# Patient Record
Sex: Female | Born: 1950 | Race: White | Hispanic: No | Smoking: Never smoker
Health system: Southern US, Community
[De-identification: ages and names within clinical notes are randomized; demographics above are authoritative.]

## PROBLEM LIST (undated history)

## (undated) DIAGNOSIS — J45909 Unspecified asthma, uncomplicated: Secondary | ICD-10-CM

## (undated) DIAGNOSIS — T7840XA Allergy, unspecified, initial encounter: Secondary | ICD-10-CM

## (undated) DIAGNOSIS — M1990 Unspecified osteoarthritis, unspecified site: Secondary | ICD-10-CM

## (undated) DIAGNOSIS — K219 Gastro-esophageal reflux disease without esophagitis: Secondary | ICD-10-CM

## (undated) DIAGNOSIS — D689 Coagulation defect, unspecified: Secondary | ICD-10-CM

## (undated) HISTORY — DX: Unspecified asthma, uncomplicated: J45.909

## (undated) HISTORY — DX: Coagulation defect, unspecified: D68.9

## (undated) HISTORY — DX: Gastro-esophageal reflux disease without esophagitis: K21.9

## (undated) HISTORY — DX: Unspecified osteoarthritis, unspecified site: M19.90

## (undated) HISTORY — DX: Allergy, unspecified, initial encounter: T78.40XA

---

## 2001-08-04 ENCOUNTER — Encounter: Payer: Self-pay | Admitting: Internal Medicine

## 2001-08-04 ENCOUNTER — Encounter: Admission: RE | Admit: 2001-08-04 | Discharge: 2001-08-04 | Payer: Self-pay | Admitting: Internal Medicine

## 2008-03-04 ENCOUNTER — Encounter: Admission: RE | Admit: 2008-03-04 | Discharge: 2008-03-04 | Payer: Self-pay | Admitting: Specialist

## 2008-11-15 ENCOUNTER — Encounter: Admission: RE | Admit: 2008-11-15 | Discharge: 2008-11-15 | Payer: Self-pay | Admitting: Specialist

## 2010-01-17 DIAGNOSIS — L719 Rosacea, unspecified: Secondary | ICD-10-CM | POA: Insufficient documentation

## 2010-01-17 DIAGNOSIS — R002 Palpitations: Secondary | ICD-10-CM | POA: Insufficient documentation

## 2010-01-17 DIAGNOSIS — R7301 Impaired fasting glucose: Secondary | ICD-10-CM | POA: Insufficient documentation

## 2010-01-17 DIAGNOSIS — K219 Gastro-esophageal reflux disease without esophagitis: Secondary | ICD-10-CM | POA: Insufficient documentation

## 2010-01-17 DIAGNOSIS — J309 Allergic rhinitis, unspecified: Secondary | ICD-10-CM | POA: Insufficient documentation

## 2010-01-17 DIAGNOSIS — R0600 Dyspnea, unspecified: Secondary | ICD-10-CM | POA: Insufficient documentation

## 2010-01-17 DIAGNOSIS — M47819 Spondylosis without myelopathy or radiculopathy, site unspecified: Secondary | ICD-10-CM | POA: Insufficient documentation

## 2010-01-17 DIAGNOSIS — N393 Stress incontinence (female) (male): Secondary | ICD-10-CM | POA: Insufficient documentation

## 2010-01-17 DIAGNOSIS — Z780 Asymptomatic menopausal state: Secondary | ICD-10-CM | POA: Insufficient documentation

## 2010-07-12 DIAGNOSIS — Z2839 Other underimmunization status: Secondary | ICD-10-CM | POA: Insufficient documentation

## 2010-10-14 HISTORY — PX: KNEE SURGERY: SHX244

## 2010-11-26 ENCOUNTER — Ambulatory Visit: Payer: Self-pay | Admitting: Specialist

## 2010-12-04 ENCOUNTER — Inpatient Hospital Stay: Payer: Self-pay | Admitting: Specialist

## 2011-02-19 ENCOUNTER — Ambulatory Visit: Payer: Self-pay | Admitting: Specialist

## 2011-03-05 ENCOUNTER — Inpatient Hospital Stay: Payer: Self-pay | Admitting: Specialist

## 2011-08-05 ENCOUNTER — Encounter: Payer: Self-pay | Admitting: Gastroenterology

## 2011-08-15 ENCOUNTER — Ambulatory Visit (AMBULATORY_SURGERY_CENTER): Payer: PRIVATE HEALTH INSURANCE | Admitting: *Deleted

## 2011-08-15 ENCOUNTER — Encounter: Payer: Self-pay | Admitting: Gastroenterology

## 2011-08-15 VITALS — Ht 65.0 in | Wt 240.0 lb

## 2011-08-15 DIAGNOSIS — Z1211 Encounter for screening for malignant neoplasm of colon: Secondary | ICD-10-CM

## 2011-08-15 MED ORDER — PEG-KCL-NACL-NASULF-NA ASC-C 100 G PO SOLR: ORAL | Status: DC

## 2011-08-28 ENCOUNTER — Encounter: Payer: Self-pay | Admitting: Gastroenterology

## 2011-08-28 ENCOUNTER — Ambulatory Visit (AMBULATORY_SURGERY_CENTER): Payer: PRIVATE HEALTH INSURANCE | Admitting: Gastroenterology

## 2011-08-28 VITALS — BP 117/67 | HR 74 | Temp 98.4°F | Resp 20 | Ht 65.0 in | Wt 240.0 lb

## 2011-08-28 DIAGNOSIS — Z1211 Encounter for screening for malignant neoplasm of colon: Secondary | ICD-10-CM

## 2011-08-28 MED ORDER — SODIUM CHLORIDE 0.9 % IV SOLN: 500.0000 mL | INTRAVENOUS | Status: DC

## 2011-08-28 NOTE — Patient Instructions (Signed)
Green and blue discharge instructions reviewed with patient and care partner.  Impressions/recommendations:  Normal colonoscopy, repeat colonoscopy in 10 years.  Resume medications as you had been taking them prior to your procedure.

## 2011-08-28 NOTE — Progress Notes (Signed)
Patient did not experience any of the following events: a burn prior to discharge; a fall within the facility; wrong site/side/patient/procedure/implant event; or a hospital transfer or hospital admission upon discharge from the facility. (G8907) Patient did not have preoperative order for IV antibiotic SSI prophylaxis. ([id number])  

## 2011-08-29 ENCOUNTER — Telehealth: Payer: Self-pay

## 2011-08-29 NOTE — Telephone Encounter (Signed)

## 2013-01-20 ENCOUNTER — Encounter: Payer: Self-pay | Admitting: Internal Medicine

## 2014-10-24 DIAGNOSIS — K589 Irritable bowel syndrome without diarrhea: Secondary | ICD-10-CM | POA: Insufficient documentation

## 2014-12-05 DIAGNOSIS — H8110 Benign paroxysmal vertigo, unspecified ear: Secondary | ICD-10-CM | POA: Insufficient documentation

## 2015-06-20 DIAGNOSIS — R310 Gross hematuria: Secondary | ICD-10-CM | POA: Insufficient documentation

## 2015-11-28 DIAGNOSIS — Z0000 Encounter for general adult medical examination without abnormal findings: Secondary | ICD-10-CM | POA: Insufficient documentation

## 2016-01-03 DIAGNOSIS — J209 Acute bronchitis, unspecified: Secondary | ICD-10-CM | POA: Insufficient documentation

## 2016-04-17 DIAGNOSIS — Z96653 Presence of artificial knee joint, bilateral: Secondary | ICD-10-CM | POA: Insufficient documentation

## 2016-05-14 DIAGNOSIS — M19031 Primary osteoarthritis, right wrist: Secondary | ICD-10-CM | POA: Diagnosis not present

## 2016-05-15 DIAGNOSIS — M25551 Pain in right hip: Secondary | ICD-10-CM | POA: Diagnosis not present

## 2016-05-28 DIAGNOSIS — M47819 Spondylosis without myelopathy or radiculopathy, site unspecified: Secondary | ICD-10-CM | POA: Diagnosis not present

## 2016-05-28 DIAGNOSIS — M25551 Pain in right hip: Secondary | ICD-10-CM | POA: Diagnosis not present

## 2016-06-04 DIAGNOSIS — M25551 Pain in right hip: Secondary | ICD-10-CM | POA: Diagnosis not present

## 2016-06-11 DIAGNOSIS — M25551 Pain in right hip: Secondary | ICD-10-CM | POA: Diagnosis not present

## 2016-06-18 DIAGNOSIS — M25551 Pain in right hip: Secondary | ICD-10-CM | POA: Diagnosis not present

## 2016-07-27 DIAGNOSIS — Z23 Encounter for immunization: Secondary | ICD-10-CM | POA: Diagnosis not present

## 2016-07-31 DIAGNOSIS — M25531 Pain in right wrist: Secondary | ICD-10-CM | POA: Diagnosis not present

## 2016-07-31 DIAGNOSIS — M19039 Primary osteoarthritis, unspecified wrist: Secondary | ICD-10-CM | POA: Diagnosis not present

## 2016-07-31 DIAGNOSIS — M1811 Unilateral primary osteoarthritis of first carpometacarpal joint, right hand: Secondary | ICD-10-CM | POA: Diagnosis not present

## 2016-08-16 DIAGNOSIS — Z1231 Encounter for screening mammogram for malignant neoplasm of breast: Secondary | ICD-10-CM | POA: Diagnosis not present

## 2016-12-02 DIAGNOSIS — Z7689 Persons encountering health services in other specified circumstances: Secondary | ICD-10-CM | POA: Diagnosis not present

## 2016-12-02 DIAGNOSIS — R7301 Impaired fasting glucose: Secondary | ICD-10-CM | POA: Diagnosis not present

## 2016-12-02 DIAGNOSIS — Z79899 Other long term (current) drug therapy: Secondary | ICD-10-CM | POA: Diagnosis not present

## 2016-12-09 DIAGNOSIS — M47819 Spondylosis without myelopathy or radiculopathy, site unspecified: Secondary | ICD-10-CM | POA: Diagnosis not present

## 2016-12-09 DIAGNOSIS — M1731 Unilateral post-traumatic osteoarthritis, right knee: Secondary | ICD-10-CM | POA: Diagnosis not present

## 2016-12-09 DIAGNOSIS — Z1389 Encounter for screening for other disorder: Secondary | ICD-10-CM | POA: Diagnosis not present

## 2016-12-09 DIAGNOSIS — Z0000 Encounter for general adult medical examination without abnormal findings: Secondary | ICD-10-CM | POA: Diagnosis not present

## 2016-12-09 DIAGNOSIS — R7301 Impaired fasting glucose: Secondary | ICD-10-CM | POA: Diagnosis not present

## 2016-12-09 DIAGNOSIS — J3089 Other allergic rhinitis: Secondary | ICD-10-CM | POA: Diagnosis not present

## 2016-12-09 DIAGNOSIS — M1732 Unilateral post-traumatic osteoarthritis, left knee: Secondary | ICD-10-CM | POA: Diagnosis not present

## 2016-12-09 DIAGNOSIS — K589 Irritable bowel syndrome without diarrhea: Secondary | ICD-10-CM | POA: Diagnosis not present

## 2016-12-09 DIAGNOSIS — Z6838 Body mass index (BMI) 38.0-38.9, adult: Secondary | ICD-10-CM | POA: Diagnosis not present

## 2016-12-09 DIAGNOSIS — K219 Gastro-esophageal reflux disease without esophagitis: Secondary | ICD-10-CM | POA: Diagnosis not present

## 2016-12-09 DIAGNOSIS — H8113 Benign paroxysmal vertigo, bilateral: Secondary | ICD-10-CM | POA: Diagnosis not present

## 2016-12-09 DIAGNOSIS — Z1231 Encounter for screening mammogram for malignant neoplasm of breast: Secondary | ICD-10-CM | POA: Diagnosis not present

## 2016-12-17 DIAGNOSIS — Z780 Asymptomatic menopausal state: Secondary | ICD-10-CM | POA: Diagnosis not present

## 2016-12-25 DIAGNOSIS — Z1212 Encounter for screening for malignant neoplasm of rectum: Secondary | ICD-10-CM | POA: Diagnosis not present

## 2017-01-22 DIAGNOSIS — H5203 Hypermetropia, bilateral: Secondary | ICD-10-CM | POA: Diagnosis not present

## 2017-01-28 DIAGNOSIS — M25511 Pain in right shoulder: Secondary | ICD-10-CM | POA: Diagnosis not present

## 2017-01-28 DIAGNOSIS — G8929 Other chronic pain: Secondary | ICD-10-CM | POA: Diagnosis not present

## 2017-02-26 DIAGNOSIS — Z96653 Presence of artificial knee joint, bilateral: Secondary | ICD-10-CM | POA: Diagnosis not present

## 2017-02-26 DIAGNOSIS — M25562 Pain in left knee: Secondary | ICD-10-CM | POA: Diagnosis not present

## 2017-02-26 DIAGNOSIS — M25661 Stiffness of right knee, not elsewhere classified: Secondary | ICD-10-CM | POA: Diagnosis not present

## 2017-02-28 DIAGNOSIS — G8929 Other chronic pain: Secondary | ICD-10-CM | POA: Diagnosis not present

## 2017-02-28 DIAGNOSIS — M7541 Impingement syndrome of right shoulder: Secondary | ICD-10-CM | POA: Diagnosis not present

## 2017-02-28 DIAGNOSIS — M25511 Pain in right shoulder: Secondary | ICD-10-CM | POA: Diagnosis not present

## 2017-05-28 DIAGNOSIS — Z79899 Other long term (current) drug therapy: Secondary | ICD-10-CM | POA: Diagnosis not present

## 2017-06-30 DIAGNOSIS — M25511 Pain in right shoulder: Secondary | ICD-10-CM | POA: Diagnosis not present

## 2017-06-30 DIAGNOSIS — G8929 Other chronic pain: Secondary | ICD-10-CM | POA: Diagnosis not present

## 2017-07-07 DIAGNOSIS — M7541 Impingement syndrome of right shoulder: Secondary | ICD-10-CM | POA: Diagnosis not present

## 2017-07-07 DIAGNOSIS — M25511 Pain in right shoulder: Secondary | ICD-10-CM | POA: Diagnosis not present

## 2017-07-07 DIAGNOSIS — G8929 Other chronic pain: Secondary | ICD-10-CM | POA: Diagnosis not present

## 2017-07-07 DIAGNOSIS — M50022 Cervical disc disorder at C5-C6 level with myelopathy: Secondary | ICD-10-CM | POA: Diagnosis not present

## 2017-08-25 DIAGNOSIS — Z803 Family history of malignant neoplasm of breast: Secondary | ICD-10-CM | POA: Diagnosis not present

## 2017-08-25 DIAGNOSIS — Z1231 Encounter for screening mammogram for malignant neoplasm of breast: Secondary | ICD-10-CM | POA: Diagnosis not present

## 2017-09-15 DIAGNOSIS — R05 Cough: Secondary | ICD-10-CM | POA: Diagnosis not present

## 2017-09-15 DIAGNOSIS — Z6837 Body mass index (BMI) 37.0-37.9, adult: Secondary | ICD-10-CM | POA: Diagnosis not present

## 2017-09-15 DIAGNOSIS — J111 Influenza due to unidentified influenza virus with other respiratory manifestations: Secondary | ICD-10-CM | POA: Diagnosis not present

## 2017-09-15 DIAGNOSIS — J189 Pneumonia, unspecified organism: Secondary | ICD-10-CM | POA: Insufficient documentation

## 2017-10-28 DIAGNOSIS — J189 Pneumonia, unspecified organism: Secondary | ICD-10-CM | POA: Diagnosis not present

## 2018-01-06 DIAGNOSIS — R7301 Impaired fasting glucose: Secondary | ICD-10-CM | POA: Diagnosis not present

## 2018-01-06 DIAGNOSIS — R82998 Other abnormal findings in urine: Secondary | ICD-10-CM | POA: Diagnosis not present

## 2018-01-06 DIAGNOSIS — Z79899 Other long term (current) drug therapy: Secondary | ICD-10-CM | POA: Diagnosis not present

## 2018-01-14 DIAGNOSIS — Z1212 Encounter for screening for malignant neoplasm of rectum: Secondary | ICD-10-CM | POA: Diagnosis not present

## 2018-01-16 DIAGNOSIS — R0609 Other forms of dyspnea: Secondary | ICD-10-CM | POA: Diagnosis not present

## 2018-01-16 DIAGNOSIS — R310 Gross hematuria: Secondary | ICD-10-CM | POA: Diagnosis not present

## 2018-01-16 DIAGNOSIS — M47819 Spondylosis without myelopathy or radiculopathy, site unspecified: Secondary | ICD-10-CM | POA: Diagnosis not present

## 2018-01-16 DIAGNOSIS — Z1389 Encounter for screening for other disorder: Secondary | ICD-10-CM | POA: Diagnosis not present

## 2018-01-16 DIAGNOSIS — Z0000 Encounter for general adult medical examination without abnormal findings: Secondary | ICD-10-CM | POA: Diagnosis not present

## 2018-01-16 DIAGNOSIS — J3089 Other allergic rhinitis: Secondary | ICD-10-CM | POA: Diagnosis not present

## 2018-01-16 DIAGNOSIS — N3281 Overactive bladder: Secondary | ICD-10-CM | POA: Insufficient documentation

## 2018-01-16 DIAGNOSIS — Z6837 Body mass index (BMI) 37.0-37.9, adult: Secondary | ICD-10-CM | POA: Diagnosis not present

## 2018-01-16 DIAGNOSIS — Z79899 Other long term (current) drug therapy: Secondary | ICD-10-CM | POA: Diagnosis not present

## 2018-01-16 DIAGNOSIS — H8110 Benign paroxysmal vertigo, unspecified ear: Secondary | ICD-10-CM | POA: Diagnosis not present

## 2018-01-16 DIAGNOSIS — K589 Irritable bowel syndrome without diarrhea: Secondary | ICD-10-CM | POA: Diagnosis not present

## 2018-01-16 DIAGNOSIS — J209 Acute bronchitis, unspecified: Secondary | ICD-10-CM | POA: Diagnosis not present

## 2018-01-16 DIAGNOSIS — D72829 Elevated white blood cell count, unspecified: Secondary | ICD-10-CM | POA: Insufficient documentation

## 2018-01-16 DIAGNOSIS — J189 Pneumonia, unspecified organism: Secondary | ICD-10-CM | POA: Diagnosis not present

## 2018-02-03 DIAGNOSIS — H5203 Hypermetropia, bilateral: Secondary | ICD-10-CM | POA: Diagnosis not present

## 2018-02-03 DIAGNOSIS — H25093 Other age-related incipient cataract, bilateral: Secondary | ICD-10-CM | POA: Diagnosis not present

## 2018-02-03 DIAGNOSIS — H524 Presbyopia: Secondary | ICD-10-CM | POA: Diagnosis not present

## 2018-02-03 DIAGNOSIS — H52222 Regular astigmatism, left eye: Secondary | ICD-10-CM | POA: Diagnosis not present

## 2018-03-06 DIAGNOSIS — D72829 Elevated white blood cell count, unspecified: Secondary | ICD-10-CM | POA: Diagnosis not present

## 2018-04-13 DIAGNOSIS — M25531 Pain in right wrist: Secondary | ICD-10-CM | POA: Diagnosis not present

## 2018-04-13 DIAGNOSIS — M13839 Other specified arthritis, unspecified wrist: Secondary | ICD-10-CM | POA: Diagnosis not present

## 2018-07-06 DIAGNOSIS — H9319 Tinnitus, unspecified ear: Secondary | ICD-10-CM | POA: Insufficient documentation

## 2018-07-22 DIAGNOSIS — Z79899 Other long term (current) drug therapy: Secondary | ICD-10-CM | POA: Diagnosis not present

## 2018-07-24 DIAGNOSIS — M25552 Pain in left hip: Secondary | ICD-10-CM | POA: Diagnosis not present

## 2018-07-24 DIAGNOSIS — M25551 Pain in right hip: Secondary | ICD-10-CM | POA: Diagnosis not present

## 2018-08-19 DIAGNOSIS — H9313 Tinnitus, bilateral: Secondary | ICD-10-CM | POA: Diagnosis not present

## 2018-08-19 DIAGNOSIS — H8111 Benign paroxysmal vertigo, right ear: Secondary | ICD-10-CM | POA: Diagnosis not present

## 2018-08-19 DIAGNOSIS — H903 Sensorineural hearing loss, bilateral: Secondary | ICD-10-CM | POA: Diagnosis not present

## 2018-08-31 DIAGNOSIS — Z1231 Encounter for screening mammogram for malignant neoplasm of breast: Secondary | ICD-10-CM | POA: Diagnosis not present

## 2018-08-31 DIAGNOSIS — Z803 Family history of malignant neoplasm of breast: Secondary | ICD-10-CM | POA: Diagnosis not present

## 2018-09-03 ENCOUNTER — Ambulatory Visit: Payer: PPO | Admitting: Podiatry

## 2018-09-03 DIAGNOSIS — L603 Nail dystrophy: Secondary | ICD-10-CM

## 2018-09-03 DIAGNOSIS — S90222A Contusion of left lesser toe(s) with damage to nail, initial encounter: Secondary | ICD-10-CM

## 2018-09-03 DIAGNOSIS — M19039 Primary osteoarthritis, unspecified wrist: Secondary | ICD-10-CM | POA: Insufficient documentation

## 2018-09-03 DIAGNOSIS — M6530 Trigger finger, unspecified finger: Secondary | ICD-10-CM | POA: Insufficient documentation

## 2018-09-03 NOTE — Progress Notes (Signed)
She presents today chief complaint of a painful hallux nail left.  States is been bothering her for the past several months but she had it a few days ago and it knocked loose.  Objective: Vital signs are stable she is alert and oriented x3 hallux left demonstrates a loose nail that is painful along the fibular border but is still attached.  There is no purulence no malodor.  Assessment: Ingrown nail damage nail hallux left.  Plan: After sterile Betadine skin prep I injected a 50-50 mixture of Marcaine plain lidocaine plain infiltrated about the hallux.  After the hallux is numb a nail avulsion was performed she tolerated procedure well without complications was provided both oral and written home-going instruction for care and soaking of the toe.  Follow-up with her in 2 weeks

## 2018-09-03 NOTE — Patient Instructions (Signed)

## 2018-09-17 ENCOUNTER — Ambulatory Visit: Payer: PPO | Admitting: Podiatry

## 2018-09-30 DIAGNOSIS — M7061 Trochanteric bursitis, right hip: Secondary | ICD-10-CM | POA: Diagnosis not present

## 2018-09-30 DIAGNOSIS — M25552 Pain in left hip: Secondary | ICD-10-CM | POA: Diagnosis not present

## 2018-09-30 DIAGNOSIS — M7062 Trochanteric bursitis, left hip: Secondary | ICD-10-CM | POA: Diagnosis not present

## 2018-09-30 DIAGNOSIS — M25551 Pain in right hip: Secondary | ICD-10-CM | POA: Diagnosis not present

## 2018-12-09 DIAGNOSIS — M7071 Other bursitis of hip, right hip: Secondary | ICD-10-CM | POA: Insufficient documentation

## 2018-12-09 DIAGNOSIS — M25551 Pain in right hip: Secondary | ICD-10-CM | POA: Insufficient documentation

## 2018-12-11 DIAGNOSIS — M7061 Trochanteric bursitis, right hip: Secondary | ICD-10-CM | POA: Diagnosis not present

## 2018-12-11 DIAGNOSIS — M25551 Pain in right hip: Secondary | ICD-10-CM | POA: Diagnosis not present

## 2018-12-11 DIAGNOSIS — M7062 Trochanteric bursitis, left hip: Secondary | ICD-10-CM | POA: Diagnosis not present

## 2018-12-25 DIAGNOSIS — M25551 Pain in right hip: Secondary | ICD-10-CM | POA: Diagnosis not present

## 2018-12-25 DIAGNOSIS — M25552 Pain in left hip: Secondary | ICD-10-CM | POA: Diagnosis not present

## 2019-03-03 DIAGNOSIS — Z79899 Other long term (current) drug therapy: Secondary | ICD-10-CM | POA: Diagnosis not present

## 2019-03-03 DIAGNOSIS — R7301 Impaired fasting glucose: Secondary | ICD-10-CM | POA: Diagnosis not present

## 2019-03-05 DIAGNOSIS — R7301 Impaired fasting glucose: Secondary | ICD-10-CM | POA: Diagnosis not present

## 2019-03-05 DIAGNOSIS — R82998 Other abnormal findings in urine: Secondary | ICD-10-CM | POA: Diagnosis not present

## 2019-03-10 DIAGNOSIS — Z1331 Encounter for screening for depression: Secondary | ICD-10-CM | POA: Diagnosis not present

## 2019-03-10 DIAGNOSIS — Z1339 Encounter for screening examination for other mental health and behavioral disorders: Secondary | ICD-10-CM | POA: Diagnosis not present

## 2019-03-10 DIAGNOSIS — H8110 Benign paroxysmal vertigo, unspecified ear: Secondary | ICD-10-CM | POA: Diagnosis not present

## 2019-03-10 DIAGNOSIS — M47819 Spondylosis without myelopathy or radiculopathy, site unspecified: Secondary | ICD-10-CM | POA: Diagnosis not present

## 2019-03-10 DIAGNOSIS — D72829 Elevated white blood cell count, unspecified: Secondary | ICD-10-CM | POA: Diagnosis not present

## 2019-03-10 DIAGNOSIS — M179 Osteoarthritis of knee, unspecified: Secondary | ICD-10-CM | POA: Diagnosis not present

## 2019-03-10 DIAGNOSIS — N3281 Overactive bladder: Secondary | ICD-10-CM | POA: Diagnosis not present

## 2019-03-10 DIAGNOSIS — Z0000 Encounter for general adult medical examination without abnormal findings: Secondary | ICD-10-CM | POA: Diagnosis not present

## 2019-03-10 DIAGNOSIS — K219 Gastro-esophageal reflux disease without esophagitis: Secondary | ICD-10-CM | POA: Diagnosis not present

## 2019-03-10 DIAGNOSIS — J309 Allergic rhinitis, unspecified: Secondary | ICD-10-CM | POA: Diagnosis not present

## 2019-03-10 DIAGNOSIS — R05 Cough: Secondary | ICD-10-CM | POA: Diagnosis not present

## 2019-03-10 DIAGNOSIS — K589 Irritable bowel syndrome without diarrhea: Secondary | ICD-10-CM | POA: Diagnosis not present

## 2019-03-10 DIAGNOSIS — H9319 Tinnitus, unspecified ear: Secondary | ICD-10-CM | POA: Diagnosis not present

## 2019-03-10 DIAGNOSIS — Z79899 Other long term (current) drug therapy: Secondary | ICD-10-CM | POA: Diagnosis not present

## 2019-04-23 DIAGNOSIS — H524 Presbyopia: Secondary | ICD-10-CM | POA: Diagnosis not present

## 2019-04-23 DIAGNOSIS — H25093 Other age-related incipient cataract, bilateral: Secondary | ICD-10-CM | POA: Diagnosis not present

## 2019-04-23 DIAGNOSIS — H5203 Hypermetropia, bilateral: Secondary | ICD-10-CM | POA: Diagnosis not present

## 2019-08-03 DIAGNOSIS — H43812 Vitreous degeneration, left eye: Secondary | ICD-10-CM | POA: Diagnosis not present

## 2019-08-03 DIAGNOSIS — H25813 Combined forms of age-related cataract, bilateral: Secondary | ICD-10-CM | POA: Diagnosis not present

## 2019-09-14 DIAGNOSIS — H43812 Vitreous degeneration, left eye: Secondary | ICD-10-CM | POA: Diagnosis not present

## 2019-09-16 DIAGNOSIS — Z1231 Encounter for screening mammogram for malignant neoplasm of breast: Secondary | ICD-10-CM | POA: Diagnosis not present

## 2019-09-16 DIAGNOSIS — Z803 Family history of malignant neoplasm of breast: Secondary | ICD-10-CM | POA: Diagnosis not present

## 2019-10-14 DIAGNOSIS — R7301 Impaired fasting glucose: Secondary | ICD-10-CM | POA: Diagnosis not present

## 2019-10-14 DIAGNOSIS — Z79899 Other long term (current) drug therapy: Secondary | ICD-10-CM | POA: Diagnosis not present

## 2020-03-30 DIAGNOSIS — Z96652 Presence of left artificial knee joint: Secondary | ICD-10-CM | POA: Diagnosis not present

## 2020-03-30 DIAGNOSIS — Z471 Aftercare following joint replacement surgery: Secondary | ICD-10-CM | POA: Diagnosis not present

## 2020-03-30 DIAGNOSIS — Z96651 Presence of right artificial knee joint: Secondary | ICD-10-CM | POA: Diagnosis not present

## 2020-03-30 DIAGNOSIS — T8484XA Pain due to internal orthopedic prosthetic devices, implants and grafts, initial encounter: Secondary | ICD-10-CM | POA: Diagnosis not present

## 2020-03-30 DIAGNOSIS — Z96653 Presence of artificial knee joint, bilateral: Secondary | ICD-10-CM | POA: Diagnosis not present

## 2020-04-12 DIAGNOSIS — R7301 Impaired fasting glucose: Secondary | ICD-10-CM | POA: Diagnosis not present

## 2020-04-12 DIAGNOSIS — E669 Obesity, unspecified: Secondary | ICD-10-CM | POA: Diagnosis not present

## 2020-04-12 DIAGNOSIS — R7989 Other specified abnormal findings of blood chemistry: Secondary | ICD-10-CM | POA: Diagnosis not present

## 2020-04-19 DIAGNOSIS — R5383 Other fatigue: Secondary | ICD-10-CM | POA: Insufficient documentation

## 2020-04-19 DIAGNOSIS — E669 Obesity, unspecified: Secondary | ICD-10-CM | POA: Diagnosis not present

## 2020-04-19 DIAGNOSIS — Z0000 Encounter for general adult medical examination without abnormal findings: Secondary | ICD-10-CM | POA: Diagnosis not present

## 2020-04-19 DIAGNOSIS — R82998 Other abnormal findings in urine: Secondary | ICD-10-CM | POA: Diagnosis not present

## 2020-04-19 DIAGNOSIS — R05 Cough: Secondary | ICD-10-CM | POA: Diagnosis not present

## 2020-04-19 DIAGNOSIS — L719 Rosacea, unspecified: Secondary | ICD-10-CM | POA: Diagnosis not present

## 2020-04-19 DIAGNOSIS — Z1212 Encounter for screening for malignant neoplasm of rectum: Secondary | ICD-10-CM | POA: Diagnosis not present

## 2020-04-19 DIAGNOSIS — R7301 Impaired fasting glucose: Secondary | ICD-10-CM | POA: Diagnosis not present

## 2020-04-19 DIAGNOSIS — Z1331 Encounter for screening for depression: Secondary | ICD-10-CM | POA: Diagnosis not present

## 2020-04-19 DIAGNOSIS — Z79899 Other long term (current) drug therapy: Secondary | ICD-10-CM | POA: Diagnosis not present

## 2020-04-19 DIAGNOSIS — M179 Osteoarthritis of knee, unspecified: Secondary | ICD-10-CM | POA: Diagnosis not present

## 2020-04-19 DIAGNOSIS — N3281 Overactive bladder: Secondary | ICD-10-CM | POA: Diagnosis not present

## 2020-04-19 DIAGNOSIS — J309 Allergic rhinitis, unspecified: Secondary | ICD-10-CM | POA: Diagnosis not present

## 2020-05-01 DIAGNOSIS — H04123 Dry eye syndrome of bilateral lacrimal glands: Secondary | ICD-10-CM | POA: Diagnosis not present

## 2020-05-01 DIAGNOSIS — H25813 Combined forms of age-related cataract, bilateral: Secondary | ICD-10-CM | POA: Diagnosis not present

## 2020-05-04 ENCOUNTER — Ambulatory Visit (INDEPENDENT_AMBULATORY_CARE_PROVIDER_SITE_OTHER): Payer: PPO

## 2020-05-04 ENCOUNTER — Encounter: Payer: Self-pay | Admitting: Podiatry

## 2020-05-04 ENCOUNTER — Other Ambulatory Visit: Payer: Self-pay

## 2020-05-04 ENCOUNTER — Ambulatory Visit: Payer: PPO | Admitting: Podiatry

## 2020-05-04 DIAGNOSIS — M7751 Other enthesopathy of right foot: Secondary | ICD-10-CM | POA: Diagnosis not present

## 2020-05-04 DIAGNOSIS — M778 Other enthesopathies, not elsewhere classified: Secondary | ICD-10-CM

## 2020-05-04 NOTE — Progress Notes (Signed)
She presents today with a chief complaint of pain across the dorsal aspect of the bilateral foot as well as the first metatarsophalangeal joint of the right foot.  States that she was out power washing the other week and was wearing crocs nonskid shoes which she usually wears it works and states that I cannot even wear them to work because they are so painful.  She started taking meloxicam once again to no avail.  She starts back to school in a few weeks.  Objective: Vital signs are stable she is alert and oriented x3.  Pulses are palpable.  She has pain to the dorsal aspect of the bilateral foot along the deep peroneal nerve distribution.  She also has pain on end range of motion of the first metatarsophalangeal joint of the right foot with a palpable spur.  Radiographs taken today demonstrates first and second TMT joint osteoarthritic changes right foot more so than the left.  She also has dorsal spurring and joint space narrowing subchondral sclerosis of the first metatarsophalangeal joint of the right foot.  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  Plan: Discussed etiology pathology conservative versus surgical therapies at this point in time I have not recommended injections of Kenalog to the dorsal aspect of the foot a total of 10 mg was injected to the dorsal aspect of the foot after sterile Betadine skin prep she tolerated procedure well with no complications.  I also injected after sterile Betadine skin prep the first metatarsophalangeal joint of the right foot with 2 mg of dexamethasone local anesthetic.  She tolerated this procedure well I will follow-up with her in 1 month if necessary.  We did discuss the possible use of Voltaren gel.

## 2020-05-31 ENCOUNTER — Ambulatory Visit: Payer: PPO | Admitting: Neurology

## 2020-05-31 ENCOUNTER — Encounter: Payer: Self-pay | Admitting: Neurology

## 2020-05-31 DIAGNOSIS — R351 Nocturia: Secondary | ICD-10-CM | POA: Diagnosis not present

## 2020-05-31 DIAGNOSIS — R0683 Snoring: Secondary | ICD-10-CM | POA: Diagnosis not present

## 2020-05-31 DIAGNOSIS — M158 Other polyosteoarthritis: Secondary | ICD-10-CM

## 2020-05-31 DIAGNOSIS — R519 Headache, unspecified: Secondary | ICD-10-CM | POA: Diagnosis not present

## 2020-05-31 DIAGNOSIS — G478 Other sleep disorders: Secondary | ICD-10-CM | POA: Insufficient documentation

## 2020-05-31 DIAGNOSIS — T43615A Adverse effect of caffeine, initial encounter: Secondary | ICD-10-CM | POA: Insufficient documentation

## 2020-05-31 DIAGNOSIS — M159 Polyosteoarthritis, unspecified: Secondary | ICD-10-CM | POA: Insufficient documentation

## 2020-05-31 NOTE — Progress Notes (Signed)
SLEEP MEDICINE CLINIC    Provider:  Larey Seat, MD  Primary Care Physician:  Crist Infante, MD [place] [address]     Referring Provider: Crist Infante, [place],  [place] [address]          Chief Complaint according to patient   Patient presents with:     New Patient (Initial Visit)     presents today stating that she wakes up as tired as she feels when she goes to bed. she has been told she snores. in last 2 yrs she has lost 30 lbs.       HISTORY OF PRESENT ILLNESS:  Caitlyn Manning is a 69 year- old Caucasian female patient of Dr. Silvestre Mesi who presents on 05/31/2020 for an evaluation of non- restorative sleep.  Chief concern according to patient : " I was very busy with my husband's care at his terminal illness, throat cancer , lung transplant for pulmonary fibrosis, when he died in 06-Jan-2014 I remained unemployed for 2 years. Now I work part time. I wake up as tired as when I went to sleep"   I have the pleasure of seeing Caitlyn Manning today, a right -handed Caucasian female with a possible sleep disorder. She  has a past medical history of Allergy, Arthritis, Cataract, and GERD (gastroesophageal reflux disease).  she wakes up as tired as she feels when she went to bed. she has been told she snores. in last 2 yrs she has lost 30 lbs. through intermittent fasting.      Sleep relevant medical history:  Obesity, BMI now 37. Nocturia/ 3-4 times,  restless sleep.    Family medical /sleep history:  No other family member on CPAP with OSA, insomnia, sleep walkers.    Social history:  Patient is working as / retired from  Her own business (car collision) and lives in a household alone. Family status is widowed, with 3 adult children, 7 grandchildren.  The patient currently works 20 hours. Pets are not present. Tobacco use- never .  ETOH use ; rare ,  Caffeine intake in form of Coffee( 4 a day ) Soda( 3 week ) Tea ( 3 a week) , no energy  drinks. Regular exercise in form of walking. Working.     Sleep habits are as follows: The patient's dinner time is between 7 PM. The patient goes to bed at 10 PM and continues to sleep for 2-3 hours, wakes for many  bathroom breaks, the first time at 1 AM..   The preferred sleep position is on her left side , with the support of 3 pillows. She has back problems.  Dreams are reportedly frequent/vivid. 5.30  AM is the usual rise time. The patient wakes up spontaneously before her alarm.  She averages only 5-7 hours.  She reports not feeling refreshed or restored in AM, with symptoms such as very dry mouth, morning headaches and residual fatigue.  Naps are taken frequently, lasting from 15-60 minutes and are more refreshing than nocturnal sleep.    Review of Systems: Out of a complete 14 system review, the patient complains of only the following symptoms, and all other reviewed systems are negative.:  Fatigue, sleepiness , snoring, fragmented sleep, Insomnia due to nocturia, morning headaches.    How likely are you to doze in the following situations: 0 = not likely, 1 = slight chance, 2 = moderate chance, 3 = high chance   Sitting and  Reading? Watching Television? Sitting inactive in a public place (theater or meeting)? As a passenger in a car for an hour without a break? Lying down in the afternoon when circumstances permit? Sitting and talking to someone? Sitting quietly after lunch without alcohol? In a car, while stopped for a few minutes in traffic?   Total = 15/ 24 points   FSS endorsed at 28/ 63 points.   Social History   Socioeconomic History   Marital status: Married    Spouse name: Not on file   Number of children: Not on file   Years of education: Not on file   Highest education level: Not on file  Occupational History   Not on file  Tobacco Use   Smoking status: Never Smoker   Smokeless tobacco: Never Used  Substance and Sexual Activity   Alcohol use: No    Drug use: No   Sexual activity: Not on file  Other Topics Concern   Not on file  Social History Narrative   Not on file   Social Determinants of Health   Financial Resource Strain:    Difficulty of Paying Living Expenses:   Food Insecurity:    Worried About [place] in the Last Year:    Arboriculturist in the Last Year:   Transportation Needs:    Film/video editor (Medical):    Lack of Transportation (Non-Medical):   Physical Activity:    Days of Exercise per Week:    Minutes of Exercise per Session:   Stress:    Feeling of Stress :   Social Connections:    Frequency of Communication with Friends and Family:    Frequency of Social Gatherings with Friends and Family:    Attends Religious Services:    Active Member of Clubs or Organizations:    Attends Music therapist:    Marital Status:     Family History  Problem Relation Age of Onset   Liver cancer Father     Past Medical History:  Diagnosis Date   Allergy    seasonal   Arthritis    Cataract    GERD (gastroesophageal reflux disease)     Past Surgical History:  Procedure Laterality Date   KNEE SURGERY  2012   bilateral knee replacement     Current Outpatient Medications on File Prior to Visit  Medication Sig Dispense Refill   acetaminophen (TYLENOL) 500 MG tablet Take 1,000 mg by mouth every 6 (six) hours as needed.       amoxicillin-clavulanate (AUGMENTIN) 875-125 MG per tablet Take 1 tablet by mouth as needed. 1 before and 1 after dental work due to knee replacements      B COMPLEX-C PO Take 1 tablet by mouth daily.     Cal Carb-Mag Hydrox-Simeth (ANTACID MULTI-SYMPTOM) 675-135-60 MG CHEW Chew by mouth.     Calcium Carb-Cholecalciferol (CALCIUM 600+D) 600-800 MG-UNIT TABS Take by mouth.     carboxymethylcellulose (REFRESH PLUS) 0.5 % SOLN 1 drop 3 (three) times daily as needed.     Doxylamine Succinate, Sleep, (SLEEP AID PO) Take by mouth.       ELDERBERRY PO Take by mouth.     Homeopathic Products (SINUS MEDICINE PO) Take by mouth.     Lysine 1000 MG TABS Take by mouth.     meloxicam (MOBIC) 15 MG tablet meloxicam 15 mg tablet     Multiple Vitamins-Minerals (CENTRUM SILVER ULTRA WOMENS PO) Take 1 tablet by mouth daily.  MYRBETRIQ 50 MG TB24 tablet Take 50 mg by mouth daily.  11   Olopatadine HCl (PATADAY OP) Apply to eye.     omeprazole (PRILOSEC) 20 MG capsule Take 20 mg by mouth 2 (two) times daily before a meal.      Triamcinolone Acetonide (NASACORT AQ NA) Place into the nose.     valACYclovir (VALTREX) 1000 MG tablet Take 2,000 mg by mouth 2 (two) times daily.       Zinc 50 MG TABS Take 1 tablet by mouth daily.     No current facility-administered medications on file prior to visit.    Allergies  Allergen Reactions   Tape    Latex Itching and Rash    Physical exam:  Today's Vitals   05/31/20 1459  BP: 120/77  Pulse: 74  Weight: 226 lb (102.5 kg)  Height: 5' 5.5" (1.664 m)   Body mass index is 37.04 kg/m.   Wt Readings from Last 3 Encounters:  05/31/20 226 lb (102.5 kg)  08/28/11 240 lb (108.9 kg)  08/15/11 240 lb (108.9 kg)     Ht Readings from Last 3 Encounters:  05/31/20 5' 5.5" (1.664 m)  08/28/11 5\' 5"  (1.651 m)  08/15/11 5\' 5"  (1.651 m)      General: The patient is awake, alert and appears not in acute distress. The patient is well groomed. Head: Normocephalic, atraumatic. Neck is supple. Mallampati 2.5,  neck circumference:15 inches .  Nasal airflow patent.  Retrognathia is seen.  Dental status: intact  Cardiovascular:  Regular rate and cardiac rhythm by pulse,  without distended neck veins. Respiratory: Lungs are clear to auscultation.  Skin:  Without evidence of ankle edema, or rash. Trunk: The patient's posture is erect.   Neurologic exam : The patient is awake and alert, oriented to place and time.   Memory subjective described as intact.  Attention span &  concentration ability appears normal.  Speech is fluent,  without dysarthria, dysphonia or aphasia.  Mood and affect are appropriate.   Cranial nerves: no loss of smell or taste reported  Pupils are equal and briskly reactive to light. Funduscopic exam deferred.  Extraocular movements in vertical and horizontal planes were intact and without nystagmus. No Diplopia. Visual fields by finger perimetry are intact. Hearing was intact to soft voice and finger rubbing.  Tinnitus in both ears, no pulsatile.   Facial sensation intact to fine touch.  Facial motor strength is symmetric and tongue and uvula move midline.  Neck ROM : rotation, tilt and flexion extension were normal for age and shoulder shrug was symmetrical.    Motor exam:  Symmetric bulk, tone and ROM.   Normal tone without cog wheeling, symmetric grip strength .Sensory:  Fine touch, pinprick and vibration were tested  and  normal.  Proprioception tested in the upper extremities was normal. Coordination: Rapid alternating movements in the fingers/hands were of normal speed.  The Finger-to-nose maneuver was intact without evidence of ataxia, dysmetria or tremor. Gait and station: Patient could rise unassisted from a seated position, walked without assistive device.  Stance is of normal width/ base and the patient turned with steps.  Toe and heel walk were deferred.  Deep tendon reflexes: in the  upper and lower extremities are symmetric and intact.  Babinski response was deferred .      After spending a total time of  45 minutes face to face and additional time for physical and neurologic examination, review of laboratory studies,  personal review of  imaging studies, reports and results of other testing and review of referral information / records as far as provided in visit, I have established the following assessments:  1)  Caitlyn Manning is at higher than average risk for having OSA, based on age , muscle tone, BMI and  Airway  shape.   she sleeps alone.  She shared a room on a mission trip and was told she is " making funny noises at night"  Nocturia is also frequently associated.   2) obesity and dry mouth- likely a snorer.   3) patient was asked to reconsider taking the Covid vaccine -    My Plan is to proceed with:    1) HST or PSG for OSA screening.    I would like to thank Crist Infante, MD and Crist Infante, [place],  [place] [address] for allowing me to meet with and to take care of this pleasant patient.   In short, Caitlyn Manning is presenting with non- restorative sleep. I plan to follow up either personally or through our NP within 2-3  month.   Electronically signed by: Larey Seat, MD 05/31/2020 3:15 PM  [facility] Board certified by The [employer] of Sleep Medicine and Diplomate of the [employer] of Sleep Medicine. Board certified In Neurology through the [place], Fellow of the [employer] of Neurology. Medical Director of [facility].

## 2020-05-31 NOTE — Patient Instructions (Signed)

## 2020-06-27 ENCOUNTER — Ambulatory Visit: Payer: PPO | Admitting: Podiatry

## 2020-07-03 ENCOUNTER — Ambulatory Visit (INDEPENDENT_AMBULATORY_CARE_PROVIDER_SITE_OTHER): Payer: PPO | Admitting: Neurology

## 2020-07-03 DIAGNOSIS — M158 Other polyosteoarthritis: Secondary | ICD-10-CM

## 2020-07-03 DIAGNOSIS — G478 Other sleep disorders: Secondary | ICD-10-CM

## 2020-07-03 DIAGNOSIS — R351 Nocturia: Secondary | ICD-10-CM

## 2020-07-03 DIAGNOSIS — G4733 Obstructive sleep apnea (adult) (pediatric): Secondary | ICD-10-CM

## 2020-07-03 DIAGNOSIS — R0683 Snoring: Secondary | ICD-10-CM

## 2020-07-18 DIAGNOSIS — G4733 Obstructive sleep apnea (adult) (pediatric): Secondary | ICD-10-CM | POA: Insufficient documentation

## 2020-07-18 NOTE — Procedures (Signed)
  Patient Information     First Name: Caitlyn L. Last Name: Manning ID: [id number]  Birth Date: [birth year] Age: 69 y.o Gender: Female  Referring Provider: Crist Infante, MD BMI: 37.8 (W=227 lb, H=5' 5'')  Neck Circ.:  15 '' Epworth:  15/24  Sleep Study Information    Study Date: 07/03/20 S/H/A Version: [id number] / 4.2.1023 / 02-01-78  History:    Caitlyn Manning is a 69 year old Caucasian female patient of Dr. Silvestre Mesi who presents on 05/31/2020 for an evaluation of non- restorative sleep. Chief concern according to patient: " I was very busy with my husband's care at his terminal illness, throat cancer, lung transplant for pulmonary fibrosis. When he died in Feb 01, 2014, I remained unemployed for 2 years. Now I work part time. I wake up as tired as when I went to sleep." Caitlyn Manning is a right -handed Caucasian female with a medical history of Allergy, Arthritis, Cataract, and GERD (gastroesophageal reflux disease), morbid obesity grade 1, grief reaction and excessive daytime sleepiness.     Summary & Diagnosis:    This HST confirms the presence of mild-moderate apnea, with strong REM sleep dependence. The REM sleep related AHI is 40 versus a NREM sleep AHI of 5.9/h. there were many brief oxygen desaturations noted, but the overall time in low oxygen was not clinically relevant. Nadir 84% .   Recommendations:      This patient has REM sleep dependent obstructive Sleep Apnea and will need PAP therapy for this kind of apnea. Dental devices and inspire technology don't work well for REM sleep apnea.  I suggest starting with autotitration CPAP device and a setting of 5-15 cm water, 1 cm EPR and mask of her choice and comfort, as well as heated humidification.   Interpreting Physician: Larey Seat, MD            Sleep Summary  Oxygen Saturation Statistics   Start Study Time: End Study Time: Total Recording Time:          10:24:34 PM 6:00:48 AM   7 h, 36 min  Total Sleep Time %  REM of Sleep Time:  6 h, 38 min  29.4    Mean: 94 Minimum: 84 Maximum: 99  Mean of Desaturations Nadirs (%):   91  Oxygen Desaturation. %:  4-9 10-20 >20 Total  Events Number Total   56  5 91.8 8.2  0 0.0  61 100.0  Oxygen Saturation: <90 <=88 <85 <80 <70  Duration (minutes): Sleep % 1.5 0.4 0.8 0.1 0.2 0.0 0.0 0.0 0.0 0.0     Respiratory Indices      Total Events REM NREM All Night  pRDI: pAHI 3%: ODI 4%: pAHIc 3%: % CSR: pAHI 4%:  127  104  61  0 0.0 62 44.7 40.0 27.0 0.0 8.9 5.9 2.0 0.0 19.5 16.0 9.4 0.0 9.5       Pulse Rate Statistics during Sleep (BPM)      Mean: 53 Minimum: 37 Maximum: 91

## 2020-07-18 NOTE — Addendum Note (Signed)
Addended by: Larey Seat on: 07/18/2020 04:08 PM   Modules accepted: Orders

## 2020-07-18 NOTE — Progress Notes (Signed)
Summary & Diagnosis:   This HST confirms the presence of mild-moderate apnea, with  strong REM sleep dependence. The REM sleep related AHI is 40  versus a NREM sleep AHI of 5.9/h. there were many brief oxygen  desaturations noted, but the overall time in low oxygen was not  clinically relevant. Nadir 84% .   Recommendations:    This patient has REM sleep dependent obstructive Sleep Apnea and  will need PAP therapy for this kind of apnea. Dental devices and  inspire technology don't work well for REM sleep apnea.   I suggest starting with autotitration CPAP device and a setting  of 5-15 cm water, 1 cm EPR and mask of her choice and comfort, as  well as heated humidification.   Interpreting Physician: Larey Seat, MD

## 2020-07-19 ENCOUNTER — Telehealth: Payer: Self-pay | Admitting: Neurology

## 2020-07-19 NOTE — Telephone Encounter (Signed)
Called patient to discuss sleep study results. No answer at this time. LVM for the patient to call back.   

## 2020-07-19 NOTE — Telephone Encounter (Signed)
-----   Message from Larey Seat, MD sent at 07/18/2020  4:08 PM EDT ----- Summary & Diagnosis:   This HST confirms the presence of mild-moderate apnea, with  strong REM sleep dependence. The REM sleep related AHI is 40  versus a NREM sleep AHI of 5.9/h. there were many brief oxygen  desaturations noted, but the overall time in low oxygen was not  clinically relevant. Nadir 84% .   Recommendations:    This patient has REM sleep dependent obstructive Sleep Apnea and  will need PAP therapy for this kind of apnea. Dental devices and  inspire technology don't work well for REM sleep apnea.   I suggest starting with autotitration CPAP device and a setting  of 5-15 cm water, 1 cm EPR and mask of her choice and comfort, as  well as heated humidification.   Interpreting Physician: Larey Seat, MD

## 2020-07-19 NOTE — Telephone Encounter (Signed)
Pt returned call and I was able to review the results in detail with her. Advised the patient of the findings and Dr Dohmeier recommending the auto CPAP. At this time the patient would like to hold off since overall was mild apnea with moving forward with CPAP. She states more recently she has started to feel better with her sleep and after waking up and doesn't feel this is necessary.

## 2020-08-08 DIAGNOSIS — L720 Epidermal cyst: Secondary | ICD-10-CM | POA: Diagnosis not present

## 2020-08-08 DIAGNOSIS — L821 Other seborrheic keratosis: Secondary | ICD-10-CM | POA: Diagnosis not present

## 2020-08-08 DIAGNOSIS — D692 Other nonthrombocytopenic purpura: Secondary | ICD-10-CM | POA: Diagnosis not present

## 2020-08-08 DIAGNOSIS — L738 Other specified follicular disorders: Secondary | ICD-10-CM | POA: Diagnosis not present

## 2020-08-08 DIAGNOSIS — D225 Melanocytic nevi of trunk: Secondary | ICD-10-CM | POA: Diagnosis not present

## 2020-08-08 DIAGNOSIS — D2262 Melanocytic nevi of left upper limb, including shoulder: Secondary | ICD-10-CM | POA: Diagnosis not present

## 2020-08-08 DIAGNOSIS — L570 Actinic keratosis: Secondary | ICD-10-CM | POA: Diagnosis not present

## 2020-08-10 DIAGNOSIS — M25512 Pain in left shoulder: Secondary | ICD-10-CM | POA: Diagnosis not present

## 2020-08-10 DIAGNOSIS — M7541 Impingement syndrome of right shoulder: Secondary | ICD-10-CM | POA: Diagnosis not present

## 2020-08-10 DIAGNOSIS — M25511 Pain in right shoulder: Secondary | ICD-10-CM | POA: Diagnosis not present

## 2020-09-18 DIAGNOSIS — Z1231 Encounter for screening mammogram for malignant neoplasm of breast: Secondary | ICD-10-CM | POA: Diagnosis not present

## 2020-09-18 DIAGNOSIS — Z803 Family history of malignant neoplasm of breast: Secondary | ICD-10-CM | POA: Diagnosis not present

## 2020-11-02 DIAGNOSIS — Z79899 Other long term (current) drug therapy: Secondary | ICD-10-CM | POA: Diagnosis not present

## 2020-11-02 DIAGNOSIS — D72829 Elevated white blood cell count, unspecified: Secondary | ICD-10-CM | POA: Diagnosis not present

## 2021-02-02 DIAGNOSIS — H1013 Acute atopic conjunctivitis, bilateral: Secondary | ICD-10-CM | POA: Diagnosis not present

## 2021-03-21 DIAGNOSIS — M79644 Pain in right finger(s): Secondary | ICD-10-CM | POA: Insufficient documentation

## 2021-03-21 DIAGNOSIS — M13842 Other specified arthritis, left hand: Secondary | ICD-10-CM | POA: Diagnosis not present

## 2021-03-21 DIAGNOSIS — M13841 Other specified arthritis, right hand: Secondary | ICD-10-CM | POA: Diagnosis not present

## 2021-03-21 DIAGNOSIS — M13849 Other specified arthritis, unspecified hand: Secondary | ICD-10-CM | POA: Diagnosis not present

## 2021-03-21 DIAGNOSIS — M19049 Primary osteoarthritis, unspecified hand: Secondary | ICD-10-CM | POA: Insufficient documentation

## 2021-04-27 DIAGNOSIS — R051 Acute cough: Secondary | ICD-10-CM | POA: Diagnosis not present

## 2021-04-27 DIAGNOSIS — J069 Acute upper respiratory infection, unspecified: Secondary | ICD-10-CM | POA: Diagnosis not present

## 2021-04-27 DIAGNOSIS — Z1152 Encounter for screening for COVID-19: Secondary | ICD-10-CM | POA: Diagnosis not present

## 2021-05-07 DIAGNOSIS — R7301 Impaired fasting glucose: Secondary | ICD-10-CM | POA: Diagnosis not present

## 2021-05-07 DIAGNOSIS — Z0000 Encounter for general adult medical examination without abnormal findings: Secondary | ICD-10-CM | POA: Diagnosis not present

## 2021-05-07 DIAGNOSIS — H25813 Combined forms of age-related cataract, bilateral: Secondary | ICD-10-CM | POA: Diagnosis not present

## 2021-05-14 DIAGNOSIS — H9319 Tinnitus, unspecified ear: Secondary | ICD-10-CM | POA: Diagnosis not present

## 2021-05-14 DIAGNOSIS — J309 Allergic rhinitis, unspecified: Secondary | ICD-10-CM | POA: Diagnosis not present

## 2021-05-14 DIAGNOSIS — N3281 Overactive bladder: Secondary | ICD-10-CM | POA: Diagnosis not present

## 2021-05-14 DIAGNOSIS — R82998 Other abnormal findings in urine: Secondary | ICD-10-CM | POA: Diagnosis not present

## 2021-05-14 DIAGNOSIS — Z79899 Other long term (current) drug therapy: Secondary | ICD-10-CM | POA: Diagnosis not present

## 2021-05-14 DIAGNOSIS — K589 Irritable bowel syndrome without diarrhea: Secondary | ICD-10-CM | POA: Diagnosis not present

## 2021-05-14 DIAGNOSIS — Z0000 Encounter for general adult medical examination without abnormal findings: Secondary | ICD-10-CM | POA: Diagnosis not present

## 2021-05-14 DIAGNOSIS — E669 Obesity, unspecified: Secondary | ICD-10-CM | POA: Diagnosis not present

## 2021-05-14 DIAGNOSIS — R7301 Impaired fasting glucose: Secondary | ICD-10-CM | POA: Diagnosis not present

## 2021-05-14 DIAGNOSIS — Z1212 Encounter for screening for malignant neoplasm of rectum: Secondary | ICD-10-CM | POA: Diagnosis not present

## 2021-05-14 DIAGNOSIS — Z1331 Encounter for screening for depression: Secondary | ICD-10-CM | POA: Diagnosis not present

## 2021-05-14 DIAGNOSIS — E785 Hyperlipidemia, unspecified: Secondary | ICD-10-CM | POA: Diagnosis not present

## 2021-05-14 DIAGNOSIS — Z23 Encounter for immunization: Secondary | ICD-10-CM | POA: Diagnosis not present

## 2021-05-15 ENCOUNTER — Other Ambulatory Visit: Payer: Self-pay | Admitting: Internal Medicine

## 2021-05-15 DIAGNOSIS — E785 Hyperlipidemia, unspecified: Secondary | ICD-10-CM

## 2021-06-11 ENCOUNTER — Ambulatory Visit
Admission: RE | Admit: 2021-06-11 | Discharge: 2021-06-11 | Disposition: A | Discharge status: Home / Self Care | Payer: No Typology Code available for payment source | Source: Ambulatory Visit | Attending: Internal Medicine | Admitting: Internal Medicine

## 2021-06-11 ENCOUNTER — Other Ambulatory Visit: Payer: Self-pay

## 2021-06-11 DIAGNOSIS — E785 Hyperlipidemia, unspecified: Secondary | ICD-10-CM

## 2021-07-11 ENCOUNTER — Ambulatory Visit (INDEPENDENT_AMBULATORY_CARE_PROVIDER_SITE_OTHER): Payer: PPO

## 2021-07-11 ENCOUNTER — Ambulatory Visit: Payer: PPO | Admitting: Podiatry

## 2021-07-11 ENCOUNTER — Other Ambulatory Visit: Payer: Self-pay

## 2021-07-11 ENCOUNTER — Encounter: Payer: Self-pay | Admitting: Podiatry

## 2021-07-11 DIAGNOSIS — M778 Other enthesopathies, not elsewhere classified: Secondary | ICD-10-CM

## 2021-07-11 DIAGNOSIS — M7661 Achilles tendinitis, right leg: Secondary | ICD-10-CM | POA: Diagnosis not present

## 2021-07-11 DIAGNOSIS — M722 Plantar fascial fibromatosis: Secondary | ICD-10-CM

## 2021-07-11 MED ORDER — METHYLPREDNISOLONE 4 MG PO TBPK: ORAL_TABLET | ORAL | 0 refills | Status: DC

## 2021-07-11 MED ORDER — DEXAMETHASONE SODIUM PHOSPHATE 120 MG/30ML IJ SOLN
2.0000 mg | Freq: Once | INTRAMUSCULAR | Status: AC
  Administered 2021-07-11: 2 mg via INTRA_ARTICULAR

## 2021-07-11 NOTE — Progress Notes (Signed)
She presents today for primary concern of her right posterior heel states is been aching for about a month morning pain is particularly bad.  Objective: Vital signs are stable alert and oriented x3.  She has pain on palpation of the Achilles tendinous insertion site right.  Radiographs taken today demonstrate a retrocalcaneal heel spur and thickening of the Achilles no other significant abnormalities identified.  Assessment: Insertional Achilles tendinitis retrocalcaneal heel spur.  Plan: Start her on methylprednisolone she will continue to take her anti-inflammatory that she is already taking.  Afterwards.  She will start with her night splint and I injected the area subcutaneously today 2 mg dexamethasone and I will follow-up with her in 1 month

## 2021-07-11 NOTE — Patient Instructions (Signed)

## 2021-07-12 ENCOUNTER — Telehealth: Payer: Self-pay | Admitting: *Deleted

## 2021-07-12 NOTE — Telephone Encounter (Signed)
Authorization request for a night splint was sent to [employer].

## 2021-08-20 ENCOUNTER — Other Ambulatory Visit: Payer: Self-pay

## 2021-08-20 ENCOUNTER — Encounter: Payer: Self-pay | Admitting: Podiatry

## 2021-08-20 ENCOUNTER — Ambulatory Visit: Payer: PPO | Admitting: Podiatry

## 2021-08-20 DIAGNOSIS — M7751 Other enthesopathy of right foot: Secondary | ICD-10-CM

## 2021-08-20 DIAGNOSIS — S86011A Strain of right Achilles tendon, initial encounter: Secondary | ICD-10-CM

## 2021-08-20 NOTE — Progress Notes (Signed)
She presents today for follow-up of her her Achilles tendinitis states that she has been doing everything that she could but is really only just a little first little bit better states that is really just not that much better at all.  States that is severe pain and is still limiting her ability perform her daily activities.  Continues to wear her night splint.  Objective: Vital signs are stable she is alert oriented x3 presents in shoes today.  Severe pain on palpation Achilles tendon no palpable ruptures though it is very hot very warm to the touch posterior heel.  I do suspect a tear of the Achilles probably partial with spurring.  Assessment: Probable Achilles tendon tear with retrocalcaneal spur and bursitis.  Plan: At this point requesting MRI for differential diagnoses as well as surgical considerations.  Last x-ray was July 11, 2021

## 2021-09-05 ENCOUNTER — Ambulatory Visit
Admission: RE | Admit: 2021-09-05 | Discharge: 2021-09-05 | Disposition: A | Discharge status: Home / Self Care | Payer: PPO | Source: Ambulatory Visit | Attending: Podiatry | Admitting: Podiatry

## 2021-09-05 ENCOUNTER — Other Ambulatory Visit: Payer: Self-pay

## 2021-09-05 DIAGNOSIS — M19071 Primary osteoarthritis, right ankle and foot: Secondary | ICD-10-CM | POA: Diagnosis not present

## 2021-09-05 DIAGNOSIS — S86311A Strain of muscle(s) and tendon(s) of peroneal muscle group at lower leg level, right leg, initial encounter: Secondary | ICD-10-CM | POA: Diagnosis not present

## 2021-09-05 DIAGNOSIS — S86011A Strain of right Achilles tendon, initial encounter: Secondary | ICD-10-CM

## 2021-09-05 DIAGNOSIS — M7751 Other enthesopathy of right foot: Secondary | ICD-10-CM

## 2021-09-10 DIAGNOSIS — D2262 Melanocytic nevi of left upper limb, including shoulder: Secondary | ICD-10-CM | POA: Diagnosis not present

## 2021-09-10 DIAGNOSIS — L738 Other specified follicular disorders: Secondary | ICD-10-CM | POA: Diagnosis not present

## 2021-09-10 DIAGNOSIS — D225 Melanocytic nevi of trunk: Secondary | ICD-10-CM | POA: Diagnosis not present

## 2021-09-10 DIAGNOSIS — D2239 Melanocytic nevi of other parts of face: Secondary | ICD-10-CM | POA: Diagnosis not present

## 2021-09-10 DIAGNOSIS — L821 Other seborrheic keratosis: Secondary | ICD-10-CM | POA: Diagnosis not present

## 2021-09-11 ENCOUNTER — Encounter: Payer: Self-pay | Admitting: Podiatry

## 2021-09-18 ENCOUNTER — Ambulatory Visit: Payer: PPO | Admitting: Podiatry

## 2021-09-24 DIAGNOSIS — Z1231 Encounter for screening mammogram for malignant neoplasm of breast: Secondary | ICD-10-CM | POA: Diagnosis not present

## 2021-09-25 ENCOUNTER — Other Ambulatory Visit: Payer: Self-pay

## 2021-09-25 ENCOUNTER — Ambulatory Visit: Payer: PPO | Admitting: Podiatry

## 2021-09-25 ENCOUNTER — Encounter: Payer: Self-pay | Admitting: Podiatry

## 2021-09-25 DIAGNOSIS — E785 Hyperlipidemia, unspecified: Secondary | ICD-10-CM | POA: Insufficient documentation

## 2021-09-25 DIAGNOSIS — S86011D Strain of right Achilles tendon, subsequent encounter: Secondary | ICD-10-CM

## 2021-09-25 DIAGNOSIS — M7661 Achilles tendinitis, right leg: Secondary | ICD-10-CM

## 2021-09-25 NOTE — Progress Notes (Signed)
She presents today for follow-up of her MRI and discussion of her Achilles tendon right.  States this really seems to be getting worse.  Objective: Vital signs are stable she is alert and oriented x3.  Pulses are palpable.  She still has tenderness on palpation to the very posterior aspect of her calcaneus where a bone spur is demonstrated on radiographs as well as MRI.  MRI read does demonstrate microtears of the insertion of the Achilles tendon.  Chronic tendinitis.  Assessment: Achilles tendinitis.  Plan: Discussed etiology pathology and surgical therapies at this point I highly recommended aggressive physical therapy to be performed at [facility] outpatient physical therapy group on [address] in [place].  Should this fail to alleviate her symptoms we discussed the use of Topaz as well as a gastroc recession with PRP.  I will refer this to Dr. Sherryle Lis if this is the case.  We did discuss open heel spur resection with Achilles tenolysis she did not want to go this route.

## 2021-09-26 ENCOUNTER — Other Ambulatory Visit: Payer: Self-pay | Admitting: Podiatry

## 2021-09-26 ENCOUNTER — Telehealth: Payer: Self-pay | Admitting: *Deleted

## 2021-09-26 MED ORDER — MELOXICAM 15 MG PO TABS: 15.0000 mg | ORAL_TABLET | Freq: Every day | ORAL | 3 refills | Status: AC

## 2021-09-26 NOTE — Telephone Encounter (Signed)
Already being prescribed by pcp per patient

## 2021-09-26 NOTE — Telephone Encounter (Signed)
Patient is calling because she thought that it was mentioned during visit a prescription was supposed to be sent to pharmacy.Please advise. Called patient to get the name of medication, left vmessage for return call.

## 2021-09-26 NOTE — Telephone Encounter (Signed)
Returned the call to patient ,no answer, left vmessage for patient to call back. Spoke with patient and she said that her PCP is already prescribing the meloxicam 15 mg for her so she won't need.

## 2021-09-27 ENCOUNTER — Ambulatory Visit: Payer: PPO | Attending: Podiatry

## 2021-09-27 DIAGNOSIS — M7661 Achilles tendinitis, right leg: Secondary | ICD-10-CM | POA: Diagnosis not present

## 2021-09-27 DIAGNOSIS — R262 Difficulty in walking, not elsewhere classified: Secondary | ICD-10-CM | POA: Insufficient documentation

## 2021-09-27 DIAGNOSIS — M79671 Pain in right foot: Secondary | ICD-10-CM | POA: Diagnosis not present

## 2021-09-27 DIAGNOSIS — S86011D Strain of right Achilles tendon, subsequent encounter: Secondary | ICD-10-CM | POA: Insufficient documentation

## 2021-09-27 NOTE — Therapy (Signed)
[facility] [address], [place], [address] Phone: [phone]   Fax:  [phone]  Physical Therapy Evaluation  Patient Details  Name: Caitlyn Manning MRN: [id number] Date of Birth: 09-Jul-1951 Referring Provider (PT): [place], [place] T, [place]  Encounter Date: 09/27/2021   PT End of Session - 09/27/21 0076     Visit Number 1    Number of Visits 17    Date for PT Re-Evaluation 11/22/21    Authorization Type 1    Authorization Time Period 10    PT Start Time 0927    PT Stop Time 1020    PT Time Calculation (min) 53 min    Activity Tolerance Patient tolerated treatment well    Behavior During Therapy [facility] for tasks assessed/performed             Past Medical History:  Diagnosis Date   Allergy    seasonal   Arthritis    Cataract    GERD (gastroesophageal reflux disease)     Past Surgical History:  Procedure Laterality Date   KNEE SURGERY  2012   bilateral knee replacement    There were no vitals filed for this visit.    Subjective Assessment - 09/27/21 0937     Subjective R Achilles/heel: 0/10 currently (pt sitting); 6/10 at worst for the past 3 months (09/27/2021)    Pertinent History R Achilles pain, small tear. Pt got a rescure this summer (dog) and the dog landed hard onto her R heel. Pain has worsened since onset. Pt has been sleeping with a boot since the end of September 2022. Has a history of R plantar fasciitis. Wears inserts at times which help with her plantar fasciitis.    Patient Stated Goals Just to be able to be up and active without being in pain.    Currently in Pain? No/denies    Pain Score 0-No pain    Pain Location Heel    Pain Orientation Right    Pain Descriptors / Indicators Aching;Throbbing;Sharp    Pain Type Chronic pain    Pain Onset More than a month ago    Pain Frequency Occasional    Aggravating Factors  Driving (uses R foot), being on her feet a lot, shopping.     Pain Relieving Factors Compression socks. Rest.                [facility] PT Assessment - 09/27/21 0933       Assessment   Medical Diagnosis S86.011D (ICD-10-CM) - Rupture of right Achilles tendon, subsequent encounter  M76.61 (ICD-10-CM) - Achilles tendinitis of right lower extremity    Referring Provider (PT) [place], Max T, [place]    Onset Date/Surgical Date 09/25/21      Precautions   Precaution Comments Micro tears      Restrictions   Other Position/Activity Restrictions No known restrictions      Balance Screen   Has the patient fallen in the past 6 months Yes    How many times? 2   Pt tripped in the yard, slipped on water. Did not hurt anything. Vertigo did not play a factor.   Has the patient had a decrease in activity level because of a fear of falling?  No    Is the patient reluctant to leave their home because of a fear of falling?  No      Home Environment   Additional Comments Pt lives in a one story home.  2 steps to enter garage with B rails. Pt lives alone but both daughters are her neighbors.      Prior Function   Vocation Retired      Observation/Other Assessments   Observations R distal Achilles swelling.    Focus on Therapeutic Outcomes (FOTO)  R foot FOTO 65      Posture/Postural Control   Posture Comments forward neck, B protracted shoulders, L foot pronation.      AROM   Right Ankle Dorsiflexion 3   9 degrees AAROM)   Left Ankle Dorsiflexion 12      Strength   Right Hip Flexion 4/5    Right Hip Extension 4/5   seated manually resisted   Right Hip External Rotation  4/5    Right Hip Internal Rotation 4-/5    Right Hip ABduction 4/5    Left Hip Flexion 4/5    Left Hip Extension 4/5   seated manually resisted   Left Hip External Rotation 4/5    Left Hip Internal Rotation 4-/5    Left Hip ABduction 4/5    Right Knee Flexion 4+/5    Right Knee Extension 5/5    Left Knee Flexion 4+/5    Left Knee Extension 5/5    Right Ankle Dorsiflexion 4+/5     Right Ankle Plantar Flexion 4+/5      Palpation   Palpation comment TTP R heel/achilles insertion area      Ambulation/Gait   Gait Comments Antalgic, decreased stance R LE                        Objective measurements completed on examination: See above findings.        No latex allergies No BP problems per pt.  No hx of osteoporosis.  No personal hx of CA  Pt states having vertigo that comes and goes. Also has B tinnitis.   (-) calf squeeze  R   MedbridgeAccess Code [email or web address]  Manual therapy  Supine STM R heel and plantar fascia to decrease soft tissue  restrictions    . Therapeutic exercise  Long sitting R ankle Eccentric PF yellow band 10x Reviewed and given as part of her HEP. Pt demonstrated and verbalized understanding. Handout provided.   Improved exercise technique, movement at target joints, use of target muscles after mod verbal, visual, tactile cues  .   Response to treatment Pt tolerated session well without aggravation of symptoms.    Clinical Impression Pt is a 70 year old female who came to physical therapy secondary to R heel pain. She also presents with TTP to R heel, decreased fascial mobility R heel and plantar surface, limited R ankle DF AROM, bilateral hip weakness, altered gait pattern, and difficulty performing weight bearing activities such as ambulation and going to the store. Pt will benefit from skilled physical therapy services to address the aforementioned deficits.               PT Education - 09/27/21 1254     Education Details ther-ex, HEP, POC    Person(s) Educated Patient    Methods Explanation;Demonstration;Tactile cues;Verbal cues;Handout    Comprehension Returned demonstration;Verbalized understanding              PT Short Term Goals - 09/27/21 1258       PT SHORT TERM GOAL #1   Title Pt will be independent wiht her initial HEP to decrease pain, improve strength and ability to ambulate  more  comfortably.    Baseline Pt has started her HEP (09/27/2021)    Time 3    Period Weeks    Status New    Target Date 10/18/21               PT Long Term Goals - 09/27/21 1312       PT LONG TERM GOAL #1   Title Pt will have a decrease in R heel/Achilles pain to 2/10 or less at worst to promote ability to ambulate, perform standing tasks more comfortably.    Baseline 6/10 at worst for the past 3 months (09/27/2021)    Time 8    Period Weeks    Status New    Target Date 11/22/21      PT LONG TERM GOAL #2   Title Patient will improve R ankle DF AROM to 10 degrees or more to promote ability to ambulate more comfortably.    Baseline R ankle DF AROM 3 degrees (09/27/2021)    Time 8    Period Weeks    Status New    Target Date 11/22/21      PT LONG TERM GOAL #3   Title Pt will improve bilateral hip extension and abduction strength by at least 1/2 MMT grade to promote ability to ambulate and perform weight bearing tasks more comfortably.    Baseline Hip extension 4/5 R and L, hip abduction 4/5 R and L (09/27/2021)    Time 8    Period Weeks    Status New    Target Date 11/22/21      PT LONG TERM GOAL #4   Title Pt will improve her R foot FOTO score by at least 10 points as a demonstration of improved function.    Baseline R foot FOTO 65 (09/27/2021)    Time 8    Period Weeks    Status New    Target Date 11/22/21                    Plan - 09/27/21 1255     Clinical Impression Statement Pt is a 70 year old female who came to physical therapy secondary to R heel pain. She also presents with TTP to R heel, decreased fascial mobility R heel and plantar surface, limited R ankle DF AROM, bilateral hip weakness, altered gait pattern, and difficulty performing weight bearing activities such as ambulation and going to the store. Pt will benefit from skilled physical therapy services to address the aforementioned deficits.    Personal Factors and Comorbidities  Comorbidity 1;Age;Fitness;Past/Current Experience;Time since onset of injury/illness/exacerbation    Comorbidities arthritis    Examination-Activity Limitations Stand;Lift;Locomotion Level;Carry;Squat;Stairs    Stability/Clinical Decision Making Evolving/Moderate complexity   Pain is worsening per pt   Clinical Decision Making Moderate    Rehab Potential Fair    PT Frequency 2x / week    PT Duration 8 weeks    PT Treatment/Interventions Neuromuscular re-education;Gait training;Stair training;Functional mobility training;Therapeutic activities;Therapeutic exercise;Balance training;Iontophoresis 4mg /ml Dexamethasone;Electrical Stimulation;Aquatic Therapy;Biofeedback;Canalith Repostioning;Cryotherapy;Ultrasound;Patient/family education;Manual techniques;Dry needling;Vestibular;Spinal Manipulations;Joint Manipulations    PT Next Visit Plan Isometric, concentric, eccentric gastroc muscle activation, hip strengthening, femoral control, manual techniques, modalities PRN    PT Home Exercise Plan MedbridgeAccess Code [email or web address]    Consulted and Agree with Plan of Care Patient             Patient will benefit from skilled therapeutic intervention in order to improve the following deficits and impairments:  Pain, Improper body mechanics, Postural  dysfunction, Difficulty walking, Decreased strength, Abnormal gait  Visit Diagnosis: Pain in right foot - Plan: PT plan of care cert/re-cert  Difficulty in walking, not elsewhere classified - Plan: PT plan of care cert/re-cert     Problem List Patient Active Problem List   Diagnosis Date Noted   Hyperlipidemia, unspecified 09/25/2021   Arthritis of hand 03/21/2021   Pain in finger of right hand 03/21/2021   OSA (obstructive sleep apnea) 07/18/2020   Uncontrolled morning headache 05/31/2020   Non-restorative sleep 05/31/2020   Nocturia more than twice per night 05/31/2020   Snoring 05/31/2020   Degenerative joint disease involving multiple joints  05/31/2020   Morbid obesity ([place]) 05/31/2020   Caffeine adverse reaction 05/31/2020   Fatigue 04/19/2020   Pain of both hip joints 12/09/2018   Bilateral hip bursitis 12/09/2018   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   Acquired trigger finger 09/03/2018   Tinnitus 07/06/2018   Acute pain of right wrist 04/13/2018   Leukocytosis 01/16/2018   Overactive bladder 01/16/2018   Pneumonia 09/15/2017   Other long term (current) drug therapy 05/28/2017   History of total knee replacement, bilateral 04/17/2016   Acute bronchitis 01/03/2016   Encounter for general adult medical examination without abnormal findings 11/28/2015   Gross hematuria 06/20/2015   Benign paroxysmal positional vertigo 12/05/2014   Irritable bowel syndrome 10/24/2014   Underimmunization status 07/12/2010   Allergic rhinitis 01/17/2010   Dyspnea 01/17/2010   Gastro-esophageal reflux disease without esophagitis 01/17/2010   Impaired fasting glucose 01/17/2010   Menopause 01/17/2010   Palpitations 01/17/2010   Rosacea 01/17/2010   Spondylosis without myelopathy 01/17/2010   Stress incontinence (female) (female) 01/17/2010   Joneen Boers PT, DPT   09/27/2021, 1:24 PM  [facility] [address], [place], [address] Phone: [phone]   Fax:  [phone]  Name: Caitlyn Manning MRN: [id number] Date of Birth: 1951/07/30

## 2021-09-27 NOTE — Patient Instructions (Signed)
Access Code: [email or web address] URL: [email or web address] Date: 09/27/2021 Prepared by: Joneen Boers  Exercises Long Sitting Eccentric Ankle Plantar Flexion with Resistance - 1 x daily - 7 x weekly - 3 sets - 10 reps

## 2021-10-01 ENCOUNTER — Ambulatory Visit: Payer: PPO

## 2021-10-01 DIAGNOSIS — M79671 Pain in right foot: Secondary | ICD-10-CM | POA: Diagnosis not present

## 2021-10-01 DIAGNOSIS — R262 Difficulty in walking, not elsewhere classified: Secondary | ICD-10-CM

## 2021-10-01 NOTE — Therapy (Signed)
[facility] [address] [place], [address] Phone: [phone]   Fax:  [phone]  Physical Therapy Treatment  Patient Details  Name: Caitlyn Manning MRN: [id number] Date of Birth: 1951-01-17 Referring Provider (PT): [place], [place], [place]   Encounter Date: 10/01/2021   PT End of Session - 10/01/21 0803     Visit Number 2    Number of Visits 17    Date for PT Re-Evaluation 11/22/21    Authorization Type 2    Authorization Time Period 10    PT Start Time 0804    PT Stop Time 0846    PT Time Calculation (min) 42 min    Activity Tolerance Patient tolerated treatment well    Behavior During Therapy [facility] for tasks assessed/performed             Past Medical History:  Diagnosis Date   Allergy    seasonal   Arthritis    Cataract    GERD (gastroesophageal reflux disease)     Past Surgical History:  Procedure Laterality Date   KNEE SURGERY  2012   bilateral knee replacement    There were no vitals filed for this visit.   Subjective Assessment - 10/01/21 0804     Subjective R foot and Achilles feels a tiny bit better. No pain currently. Has not worn the night boot since last session.    Pertinent History R Achilles pain, small tear. Pt got a rescure this summer (dog) and the dog landed hard onto her R heel. Pain has worsened since onset. Pt has been sleeping with a boot since the end of September 2022. Has a history of R plantar fasciitis. Wears inserts at times which help with her plantar fasciitis.    Patient Stated Goals Just to be able to be up and active without being in pain.    Currently in Pain? No/denies    Pain Score 0-No pain    Pain Onset More than a month ago                                        PT Education - 10/01/21 1323     Education Details ther-ex    [employer]) Educated Patient    Methods Explanation;Demonstration;Tactile cues;Verbal cues     Comprehension Returned demonstration;Verbalized understanding            Objective         No latex allergies No BP problems per pt.  No hx of osteoporosis.  No personal hx of CA   Pt states having vertigo that comes and goes. Also has B tinnitis.    (-) calf squeeze  R     MedbridgeAccess Code [email or web address]   Manual therapy Supine STM R heel and plantar fascia to decrease soft tissue  restrictions      . Therapeutic exercise Seated manually resisted R ankle PF isometrics at 40% effort 1 minute with 1 min rest breaks 5x  Standing hip abduction green band around thighs R 10x3 L 10x3  Standing hip extension green band around thighs R 10x L 10x   Standing B mini squats 10x2  Standing B ankle DF/PF or rockerboard with B UE assist 2 minutes  SLS with contralateral UE assist   R 10 seconds x 4  R hip bursitis feeling.    Side stepping 20  ft to the R and 20 ft to the L      Improved exercise technique, movement at target joints, use of target muscles after mod verbal, visual, tactile cues   .    Response to treatment Pt tolerated session well without aggravation of symptoms.      Clinical Impression Worked on decreasing soft tissue restrictions R heel and plantar foot area to promote movement and blood flow, followed by proper stress to affected tissues to promote healing. Heel feels good after session reported by pt. Pt will benefit from continued skilled physical therapy services to decrease pain, improve strength and function.        PT Short Term Goals - 09/27/21 1258       PT SHORT TERM GOAL #1   Title Pt will be independent wiht her initial HEP to decrease pain, improve strength and ability to ambulate more comfortably.    Baseline Pt has started her HEP (09/27/2021)    Time 3    Period Weeks    Status New    Target Date 10/18/21               PT Long Term Goals - 09/27/21 1312       PT LONG TERM GOAL #1   Title Pt will have a  decrease in R heel/Achilles pain to 2/10 or less at worst to promote ability to ambulate, perform standing tasks more comfortably.    Baseline 6/10 at worst for the past 3 months (09/27/2021)    Time 8    Period Weeks    Status New    Target Date 11/22/21      PT LONG TERM GOAL #2   Title Patient will improve R ankle DF AROM to 10 degrees or more to promote ability to ambulate more comfortably.    Baseline R ankle DF AROM 3 degrees (09/27/2021)    Time 8    Period Weeks    Status New    Target Date 11/22/21      PT LONG TERM GOAL #3   Title Pt will improve bilateral hip extension and abduction strength by at least 1/2 MMT grade to promote ability to ambulate and perform weight bearing tasks more comfortably.    Baseline Hip extension 4/5 R and L, hip abduction 4/5 R and L (09/27/2021)    Time 8    Period Weeks    Status New    Target Date 11/22/21      PT LONG TERM GOAL #4   Title Pt will improve her R foot FOTO score by at least 10 points as a demonstration of improved function.    Baseline R foot FOTO 65 (09/27/2021)    Time 8    Period Weeks    Status New    Target Date 11/22/21                   Plan - 10/01/21 0844     Clinical Impression Statement Worked on decreasing soft tissue restrictions R heel and plantar foot area to promote movement and blood flow, followed by proper stress to affected tissues to promote healing. Heel feels good after session reported by pt. Pt will benefit from continued skilled physical therapy services to decrease pain, improve strength and function.    Personal Factors and Comorbidities Comorbidity 1;Age;Fitness;Past/Current Experience;Time since onset of injury/illness/exacerbation    Comorbidities arthritis    Examination-Activity Limitations Stand;Lift;Locomotion Level;Carry;Squat;Stairs    Stability/Clinical Decision Making Stable/Uncomplicated  Pain is worsening per pt   Clinical Decision Making Low    Rehab Potential Fair     PT Frequency 2x / week    PT Duration 8 weeks    PT Treatment/Interventions Neuromuscular re-education;Gait training;Stair training;Functional mobility training;Therapeutic activities;Therapeutic exercise;Balance training;Iontophoresis 4mg /ml Dexamethasone;Electrical Stimulation;Aquatic Therapy;Biofeedback;Canalith Repostioning;Cryotherapy;Ultrasound;Patient/family education;Manual techniques;Dry needling;Vestibular;Spinal Manipulations;Joint Manipulations    PT Next Visit Plan Isometric, concentric, eccentric gastroc muscle activation, hip strengthening, femoral control, manual techniques, modalities PRN    PT Home Exercise Plan MedbridgeAccess Code [email or web address]    Consulted and Agree with Plan of Care Patient             Patient will benefit from skilled therapeutic intervention in order to improve the following deficits and impairments:  Pain, Improper body mechanics, Postural dysfunction, Difficulty walking, Decreased strength, Abnormal gait  Visit Diagnosis: Pain in right foot  Difficulty in walking, not elsewhere classified     Problem List Patient Active Problem List   Diagnosis Date Noted   Hyperlipidemia, unspecified 09/25/2021   Arthritis of hand 03/21/2021   Pain in finger of right hand 03/21/2021   OSA (obstructive sleep apnea) 07/18/2020   Uncontrolled morning headache 05/31/2020   Non-restorative sleep 05/31/2020   Nocturia more than twice per night 05/31/2020   Snoring 05/31/2020   Degenerative joint disease involving multiple joints 05/31/2020   Morbid obesity ([place]) 05/31/2020   Caffeine adverse reaction 05/31/2020   Fatigue 04/19/2020   Pain of both hip joints 12/09/2018   Bilateral hip bursitis 12/09/2018   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   Acquired trigger finger 09/03/2018   Tinnitus 07/06/2018   Acute pain of right wrist 04/13/2018   Leukocytosis 01/16/2018   Overactive bladder 01/16/2018   Pneumonia 09/15/2017   Other long term (current) drug  therapy 05/28/2017   History of total knee replacement, bilateral 04/17/2016   Acute bronchitis 01/03/2016   Encounter for general adult medical examination without abnormal findings 11/28/2015   Gross hematuria 06/20/2015   Benign paroxysmal positional vertigo 12/05/2014   Irritable bowel syndrome 10/24/2014   Underimmunization status 07/12/2010   Allergic rhinitis 01/17/2010   Dyspnea 01/17/2010   Gastro-esophageal reflux disease without esophagitis 01/17/2010   Impaired fasting glucose 01/17/2010   Menopause 01/17/2010   Palpitations 01/17/2010   Rosacea 01/17/2010   Spondylosis without myelopathy 01/17/2010   Stress incontinence (female) (female) 01/17/2010   Joneen Boers PT, DPT  10/01/2021, 1:25 PM  [facility] [address] [place], [address] Phone: [phone]   Fax:  [phone]  Name: Caitlyn Manning MRN: [id number] Date of Birth: 09/12/1951

## 2021-10-04 ENCOUNTER — Ambulatory Visit: Payer: PPO

## 2021-10-04 DIAGNOSIS — M79671 Pain in right foot: Secondary | ICD-10-CM | POA: Diagnosis not present

## 2021-10-04 DIAGNOSIS — R262 Difficulty in walking, not elsewhere classified: Secondary | ICD-10-CM

## 2021-10-04 NOTE — Therapy (Signed)
[facility] [address] [place], [address] Phone: [phone]   Fax:  [phone]  Physical Therapy Treatment  Patient Details  Name: Caitlyn Manning MRN: [id number] Date of Birth: [birth year] Referring Provider (PT): [place], [place], [place]   Encounter Date: 10/04/2021   PT End of Session - 10/04/21 1419     Visit Number 3    Number of Visits 17    Date for PT Re-Evaluation 11/22/21    Authorization Type 3    Authorization Time Period 10    PT Start Time 1420    PT Stop Time 1500    PT Time Calculation (min) 40 min    Activity Tolerance Patient tolerated treatment well    Behavior During Therapy [facility] for tasks assessed/performed             Past Medical History:  Diagnosis Date   Allergy    seasonal   Arthritis    Cataract    GERD (gastroesophageal reflux disease)     Past Surgical History:  Procedure Laterality Date   KNEE SURGERY  2012   bilateral knee replacement    There were no vitals filed for this visit.   Subjective Assessment - 10/04/21 1421     Subjective R foot is sometimes good, sometimes its hurting. It comes and goes. No pain walking from waiting room to gym as well as from car to the clinic. 4-5/10 at worst for the past 7 days.    Pertinent History R Achilles pain, small tear. Pt got a rescure this summer (dog) and the dog landed hard onto her R heel. Pain has worsened since onset. Pt has been sleeping with a boot since the end of September 2022. Has a history of R plantar fasciitis. Wears inserts at times which help with her plantar fasciitis.    Patient Stated Goals Just to be able to be up and active without being in pain.    Currently in Pain? No/denies    Pain Onset More than a month ago                                        PT Education - 10/04/21 1511     Education Details ther-ex    Person(s) Educated Patient    Methods  Explanation;Demonstration;Tactile cues;Verbal cues    Comprehension Returned demonstration;Verbalized understanding           Objective         No latex allergies No BP problems per pt.  No hx of osteoporosis.  No personal hx of CA   Pt states having vertigo that comes and goes. Also has B tinnitis.    (-) calf squeeze  R     MedbridgeAccess Code [email or web address]   Manual therapy  Seated STM R heel and plantar fascia to decrease soft tissue  restrictions      . Therapeutic exercise Seated manually resisted R ankle PF isometrics at 40% effort 1 minute with 1 min rest breaks 5x   Seated manually resisted eccentric PF 10x3  Standing B forward weight shift, heels on floor 10x10 seconds  Static mini lunge with one UE assist   R 10x2  Standing R great toe stretch 30 seconds for 2 sets   Standing B ankle DF/PF or rockerboard with B UE assist 3 minutes. Feels good per pt.  Performed to promote gastroc stretch gently.      Improved exercise technique, movement at target joints, use of target muscles after min to mod verbal, visual, tactile cues   .    Response to treatment Fair tolerance to today's session.     Clinical Impression Continued working on improving fascial mobility R heel and plantar foot to promote better movement at affected areas. Continued with isometric and gentle eccentric muscle activation to gastroc/soleus muscles to promote healing to her Achilles. Fair tolerance to today's session.  Pt will benefit from continued skilled physical therapy services to decrease pain, improve strength and function.          PT Short Term Goals - 09/27/21 1258       PT SHORT TERM GOAL #1   Title Pt will be independent wiht her initial HEP to decrease pain, improve strength and ability to ambulate more comfortably.    Baseline Pt has started her HEP (09/27/2021)    Time 3    Period Weeks    Status New    Target Date 10/18/21               PT Long Term  Goals - 09/27/21 1312       PT LONG TERM GOAL #1   Title Pt will have a decrease in R heel/Achilles pain to 2/10 or less at worst to promote ability to ambulate, perform standing tasks more comfortably.    Baseline 6/10 at worst for the past 3 months (09/27/2021)    Time 8    Period Weeks    Status New    Target Date 11/22/21      PT LONG TERM GOAL #2   Title Patient will improve R ankle DF AROM to 10 degrees or more to promote ability to ambulate more comfortably.    Baseline R ankle DF AROM 3 degrees (09/27/2021)    Time 8    Period Weeks    Status New    Target Date 11/22/21      PT LONG TERM GOAL #3   Title Pt will improve bilateral hip extension and abduction strength by at least 1/2 MMT grade to promote ability to ambulate and perform weight bearing tasks more comfortably.    Baseline Hip extension 4/5 R and L, hip abduction 4/5 R and L (09/27/2021)    Time 8    Period Weeks    Status New    Target Date 11/22/21      PT LONG TERM GOAL #4   Title Pt will improve her R foot FOTO score by at least 10 points as a demonstration of improved function.    Baseline R foot FOTO 65 (09/27/2021)    Time 8    Period Weeks    Status New    Target Date 11/22/21                   Plan - 10/04/21 1456     Clinical Impression Statement Continued working on improving fascial mobility R heel and plantar foot to promote better movement at affected areas. Continued with isometric and gentle eccentric muscle activation to gastroc/soleus muscles to promote healing to her Achilles. Fair tolerance to today's session.  Pt will benefit from continued skilled physical therapy services to decrease pain, improve strength and function.    Personal Factors and Comorbidities Comorbidity 1;Age;Fitness;Past/Current Experience;Time since onset of injury/illness/exacerbation    Comorbidities arthritis    Examination-Activity Limitations Stand;Lift;Locomotion Level;Carry;Squat;Stairs  Stability/Clinical Decision Making Stable/Uncomplicated   Pain is worsening per pt   Clinical Decision Making Low    Rehab Potential Fair    PT Frequency 2x / week    PT Duration 8 weeks    PT Treatment/Interventions Neuromuscular re-education;Gait training;Stair training;Functional mobility training;Therapeutic activities;Therapeutic exercise;Balance training;Iontophoresis 4mg /ml Dexamethasone;Electrical Stimulation;Aquatic Therapy;Biofeedback;Canalith Repostioning;Cryotherapy;Ultrasound;Patient/family education;Manual techniques;Dry needling;Vestibular;Spinal Manipulations;Joint Manipulations    PT Next Visit Plan Isometric, concentric, eccentric gastroc muscle activation, hip strengthening, femoral control, manual techniques, modalities PRN    PT Home Exercise Plan MedbridgeAccess Code [email or web address]    Consulted and Agree with Plan of Care Patient             Patient will benefit from skilled therapeutic intervention in order to improve the following deficits and impairments:  Pain, Improper body mechanics, Postural dysfunction, Difficulty walking, Decreased strength, Abnormal gait  Visit Diagnosis: Pain in right foot  Difficulty in walking, not elsewhere classified     Problem List Patient Active Problem List   Diagnosis Date Noted   Hyperlipidemia, unspecified 09/25/2021   Arthritis of hand 03/21/2021   Pain in finger of right hand 03/21/2021   OSA (obstructive sleep apnea) 07/18/2020   Uncontrolled morning headache 05/31/2020   Non-restorative sleep 05/31/2020   Nocturia more than twice per night 05/31/2020   Snoring 05/31/2020   Degenerative joint disease involving multiple joints 05/31/2020   Morbid obesity ([place]) 05/31/2020   Caffeine adverse reaction 05/31/2020   Fatigue 04/19/2020   Pain of both hip joints 12/09/2018   Bilateral hip bursitis 12/09/2018   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   Acquired trigger finger 09/03/2018   Tinnitus 07/06/2018   Acute pain of  right wrist 04/13/2018   Leukocytosis 01/16/2018   Overactive bladder 01/16/2018   Pneumonia 09/15/2017   Other long term (current) drug therapy 05/28/2017   History of total knee replacement, bilateral 04/17/2016   Acute bronchitis 01/03/2016   Encounter for general adult medical examination without abnormal findings 11/28/2015   Gross hematuria 06/20/2015   Benign paroxysmal positional vertigo 12/05/2014   Irritable bowel syndrome 10/24/2014   Underimmunization status 07/12/2010   Allergic rhinitis 01/17/2010   Dyspnea 01/17/2010   Gastro-esophageal reflux disease without esophagitis 01/17/2010   Impaired fasting glucose 01/17/2010   Menopause 01/17/2010   Palpitations 01/17/2010   Rosacea 01/17/2010   Spondylosis without myelopathy 01/17/2010   Stress incontinence (female) (female) 01/17/2010    Joneen Boers PT, DPT   10/04/2021, 3:16 PM  [facility] [address], [place], [address] Phone: [phone]   Fax:  [phone]  Name: Caitlyn Manning MRN: [id number] Date of Birth: 12/22/1950

## 2021-10-10 ENCOUNTER — Ambulatory Visit: Payer: PPO

## 2021-10-10 DIAGNOSIS — M79671 Pain in right foot: Secondary | ICD-10-CM

## 2021-10-10 DIAGNOSIS — R262 Difficulty in walking, not elsewhere classified: Secondary | ICD-10-CM

## 2021-10-10 NOTE — Therapy (Signed)
[facility] [address], [place], [address] Phone: [phone]   Fax:  [phone]  Physical Therapy Treatment  Patient Details  Name: Caitlyn Manning MRN: [id number] Date of Birth: Mar 02, 1951 Referring Provider (PT): [place], [place], [place]   Encounter Date: 10/10/2021   PT End of Session - 10/10/21 1332     Visit Number 4    Number of Visits 17    Date for PT Re-Evaluation 11/22/21    Authorization Type 4    Authorization Time Period 10    PT Start Time 1332    PT Stop Time 2751    PT Time Calculation (min) 35 min    Activity Tolerance Patient tolerated treatment well    Behavior During Therapy [facility] for tasks assessed/performed             Past Medical History:  Diagnosis Date   Allergy    seasonal   Arthritis    Cataract    GERD (gastroesophageal reflux disease)     Past Surgical History:  Procedure Laterality Date   KNEE SURGERY  2012   bilateral knee replacement    There were no vitals filed for this visit.   Subjective Assessment - 10/10/21 1333     Subjective R Achilles is so so. Sometimes she thinks it better but then it hurt driving over here. No pain with walking from waiting room to treatment room but had about 5/10 from car to waiting room. Was ok after last vist. symptoms are not as often.    Pertinent History R Achilles pain, small tear. Pt got a rescure this summer (dog) and the dog landed hard onto her R heel. Pain has worsened since onset. Pt has been sleeping with a boot since the end of September 2022. Has a history of R plantar fasciitis. Wears inserts at times which help with her plantar fasciitis.    Patient Stated Goals Just to be able to be up and active without being in pain.    Currently in Pain? No/denies    Pain Onset More than a month ago                                        PT Education - 10/10/21 1410     Education Details  ther-ex    Person(s) Educated Patient    Methods Explanation;Demonstration;Tactile cues;Verbal cues    Comprehension Returned demonstration;Verbalized understanding            Objective         No latex allergies No BP problems per pt.  No hx of osteoporosis.  No personal hx of CA   Pt states having vertigo that comes and goes. Also has B tinnitis.    (-) calf squeeze  R     MedbridgeAccess Code [email or web address]   Manual therapy  Seated STM R heel and plantar fascia to decrease soft tissue  restrictions      Therapeutic exercise  Seated manually resisted eccentric PF 10x3   Seated manually resisted concentric PF 10x3    Standing B ankle DF/PF or rockerboard with B UE assist 2 minutes.   Static mini lunge with one UE assist              R 10x3   Standing R great toe stretch 30 seconds for 3  sets  SLS with contralateral UE assist  R LE 15 seconds x 5    Improved exercise technique, movement at target joints, use of target muscles after min to mod verbal, visual, tactile cues   .    Response to treatment Pt tolerated session well without aggravation of symptoms.      Clinical Impression Continued working on decreasing soft tissue restrictions L heel and plantar foot area to promote better movement. Continued working on isometric, concentric and eccentric loading to promote healing to affected tissues. Decreased frequency of symptoms based on subjective reports. Pt will benefit from continued skilled physical therapy services to decrease pain, improve strength and function.          PT Short Term Goals - 09/27/21 1258       PT SHORT TERM GOAL #1   Title Pt will be independent wiht her initial HEP to decrease pain, improve strength and ability to ambulate more comfortably.    Baseline Pt has started her HEP (09/27/2021)    Time 3    Period Weeks    Status New    Target Date 10/18/21               PT Long Term Goals - 09/27/21 1312       PT LONG  TERM GOAL #1   Title Pt will have a decrease in R heel/Achilles pain to 2/10 or less at worst to promote ability to ambulate, perform standing tasks more comfortably.    Baseline 6/10 at worst for the past 3 months (09/27/2021)    Time 8    Period Weeks    Status New    Target Date 11/22/21      PT LONG TERM GOAL #2   Title Patient will improve R ankle DF AROM to 10 degrees or more to promote ability to ambulate more comfortably.    Baseline R ankle DF AROM 3 degrees (09/27/2021)    Time 8    Period Weeks    Status New    Target Date 11/22/21      PT LONG TERM GOAL #3   Title Pt will improve bilateral hip extension and abduction strength by at least 1/2 MMT grade to promote ability to ambulate and perform weight bearing tasks more comfortably.    Baseline Hip extension 4/5 R and L, hip abduction 4/5 R and L (09/27/2021)    Time 8    Period Weeks    Status New    Target Date 11/22/21      PT LONG TERM GOAL #4   Title Pt will improve her R foot FOTO score by at least 10 points as a demonstration of improved function.    Baseline R foot FOTO 65 (09/27/2021)    Time 8    Period Weeks    Status New    Target Date 11/22/21                   Plan - 10/10/21 1411     Personal Factors and Comorbidities Comorbidity 1;Age;Fitness;Past/Current Experience;Time since onset of injury/illness/exacerbation    Comorbidities arthritis    Examination-Activity Limitations Stand;Lift;Locomotion Level;Carry;Squat;Stairs    Stability/Clinical Decision Making Stable/Uncomplicated   Pain is worsening per pt   Rehab Potential Fair    PT Frequency 2x / week    PT Duration 8 weeks    PT Treatment/Interventions Neuromuscular re-education;Gait training;Stair training;Functional mobility training;Therapeutic activities;Therapeutic exercise;Balance training;Iontophoresis 4mg /ml Dexamethasone;Electrical Stimulation;Aquatic Therapy;Biofeedback;Canalith  Repostioning;Cryotherapy;Ultrasound;Patient/family education;Manual techniques;Dry needling;Vestibular;Spinal Manipulations;Joint Manipulations  PT Next Visit Plan Isometric, concentric, eccentric gastroc muscle activation, hip strengthening, femoral control, manual techniques, modalities PRN    PT Home Exercise Plan MedbridgeAccess Code [email or web address]    Consulted and Agree with Plan of Care Patient             Patient will benefit from skilled therapeutic intervention in order to improve the following deficits and impairments:  Pain, Improper body mechanics, Postural dysfunction, Difficulty walking, Decreased strength, Abnormal gait  Visit Diagnosis: Pain in right foot  Difficulty in walking, not elsewhere classified     Problem List Patient Active Problem List   Diagnosis Date Noted   Hyperlipidemia, unspecified 09/25/2021   Arthritis of hand 03/21/2021   Pain in finger of right hand 03/21/2021   OSA (obstructive sleep apnea) 07/18/2020   Uncontrolled morning headache 05/31/2020   Non-restorative sleep 05/31/2020   Nocturia more than twice per night 05/31/2020   Snoring 05/31/2020   Degenerative joint disease involving multiple joints 05/31/2020   Morbid obesity ([place]) 05/31/2020   Caffeine adverse reaction 05/31/2020   Fatigue 04/19/2020   Pain of both hip joints 12/09/2018   Bilateral hip bursitis 12/09/2018   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   Acquired trigger finger 09/03/2018   Tinnitus 07/06/2018   Acute pain of right wrist 04/13/2018   Leukocytosis 01/16/2018   Overactive bladder 01/16/2018   Pneumonia 09/15/2017   Other long term (current) drug therapy 05/28/2017   History of total knee replacement, bilateral 04/17/2016   Acute bronchitis 01/03/2016   Encounter for general adult medical examination without abnormal findings 11/28/2015   Gross hematuria 06/20/2015   Benign paroxysmal positional vertigo 12/05/2014   Irritable bowel syndrome 10/24/2014    Underimmunization status 07/12/2010   Allergic rhinitis 01/17/2010   Dyspnea 01/17/2010   Gastro-esophageal reflux disease without esophagitis 01/17/2010   Impaired fasting glucose 01/17/2010   Menopause 01/17/2010   Palpitations 01/17/2010   Rosacea 01/17/2010   Spondylosis without myelopathy 01/17/2010   Stress incontinence (female) (female) 01/17/2010    Joneen Boers PT, DPT  10/10/2021, 2:12 PM  [facility] [address], [place], [address] Phone: [phone]   Fax:  [phone]  Name: Caitlyn Manning MRN: [id number] Date of Birth: 1951/05/23

## 2021-10-16 ENCOUNTER — Ambulatory Visit: Payer: PPO | Attending: Podiatry

## 2021-10-16 DIAGNOSIS — M79671 Pain in right foot: Secondary | ICD-10-CM | POA: Insufficient documentation

## 2021-10-16 DIAGNOSIS — R262 Difficulty in walking, not elsewhere classified: Secondary | ICD-10-CM | POA: Diagnosis not present

## 2021-10-16 NOTE — Patient Instructions (Signed)
Access Code: [email or web address] URL: [email or web address] Date: 10/16/2021 Prepared by: Joneen Boers  Exercises Long Sitting Eccentric Ankle Plantar Flexion with Resistance - 1 x daily - 7 x weekly - 3 sets - 10 reps  Upgraded to green band

## 2021-10-16 NOTE — Therapy (Signed)
[facility] [address], [place], [address] Phone: [phone]   Fax:  [phone]  Physical Therapy Treatment  Patient Details  Name: Caitlyn Manning MRN: [id number] Date of Birth: [birth year] Referring Provider (PT): [place], [place] T, [place]   Encounter Date: 10/16/2021   PT End of Session - 10/16/21 1549     Visit Number 5    Number of Visits 17    Date for PT Re-Evaluation 11/22/21    Authorization Type 5    Authorization Time Period 10    PT Start Time 3382    PT Stop Time 1629    PT Time Calculation (min) 39 min    Activity Tolerance Patient tolerated treatment well    Behavior During Therapy [facility] for tasks assessed/performed             Past Medical History:  Diagnosis Date   Allergy    seasonal   Arthritis    Cataract    GERD (gastroesophageal reflux disease)     Past Surgical History:  Procedure Laterality Date   KNEE SURGERY  2012   bilateral knee replacement    There were no vitals filed for this visit.   Subjective Assessment - 10/16/21 1551     Subjective R Achilles is not good. Hurting really bad today. Has been running errands. Hurt after last session. Does not think it is helping.  8/10 currently when walking. 1/10 at rest.  Does not wear arch supports in her current shoes.    Pertinent History R Achilles pain, small tear. Pt got a rescure this summer (dog) and the dog landed hard onto her R heel. Pain has worsened since onset. Pt has been sleeping with a boot since the end of September 2022. Has a history of R plantar fasciitis. Wears inserts at times which help with her plantar fasciitis.    Patient Stated Goals Just to be able to be up and active without being in pain.    Currently in Pain? Yes    Pain Score 8     Pain Onset More than a month ago                                        PT Education - 10/16/21 1742     Education Details  ther-ex, HEP upgrade    Person(s) Educated Patient    Methods Explanation;Demonstration;Tactile cues;Verbal cues    Comprehension Returned demonstration;Verbalized understanding           Objective         No latex allergies No BP problems per pt.  No hx of osteoporosis.  No personal hx of CA   Pt states having vertigo that comes and goes. Also has B tinnitis.    (-) calf squeeze  R     MedbridgeAccess Code [email or web address]   Manual therapy   Low dye tape to R foot to support arch  No difference on comfort level with gait afterwards  Seated STM R gastroc/soleous muscles to decrease tension      Therapeutic exercise   Gait to assess effectiveness of treatment.   Seated R ankle DF 10x5 seconds for 2 sets  Long sitting R ankle Eccentric PF green band (per pt yellow was very easy) 10x3  Easy per pt.   Slight improved comfort level with gait per pt.  Standng L trunk side bend 15 seconds x 5  Seated trunk flexion stretch 15 seconds x 5   No difference with R heel/Achilles pain with trunk stretches No low back involvement in symptoms observed       Improved exercise technique, movement at target joints, use of target muscles after min to mod verbal, visual, tactile cues   .    Response to treatment Pt tolerated session well without aggravation of symptoms.      Clinical Impression Slight decrease in Achilles symptoms with gentle eccentric loading. Difficulty with progress in which partial thickness insertion tears of the central tendon may play a factor. Pt tolerated session well without aggravation of symptoms. Pt will benefit from continued skilled physical therapy services to decrease pain, improve strength and function.        PT Short Term Goals - 09/27/21 1258       PT SHORT TERM GOAL #1   Title Pt will be independent wiht her initial HEP to decrease pain, improve strength and ability to ambulate more comfortably.    Baseline Pt has started her HEP  (09/27/2021)    Time 3    Period Weeks    Status New    Target Date 10/18/21               PT Long Term Goals - 09/27/21 1312       PT LONG TERM GOAL #1   Title Pt will have a decrease in R heel/Achilles pain to 2/10 or less at worst to promote ability to ambulate, perform standing tasks more comfortably.    Baseline 6/10 at worst for the past 3 months (09/27/2021)    Time 8    Period Weeks    Status New    Target Date 11/22/21      PT LONG TERM GOAL #2   Title Patient will improve R ankle DF AROM to 10 degrees or more to promote ability to ambulate more comfortably.    Baseline R ankle DF AROM 3 degrees (09/27/2021)    Time 8    Period Weeks    Status New    Target Date 11/22/21      PT LONG TERM GOAL #3   Title Pt will improve bilateral hip extension and abduction strength by at least 1/2 MMT grade to promote ability to ambulate and perform weight bearing tasks more comfortably.    Baseline Hip extension 4/5 R and L, hip abduction 4/5 R and L (09/27/2021)    Time 8    Period Weeks    Status New    Target Date 11/22/21      PT LONG TERM GOAL #4   Title Pt will improve her R foot FOTO score by at least 10 points as a demonstration of improved function.    Baseline R foot FOTO 65 (09/27/2021)    Time 8    Period Weeks    Status New    Target Date 11/22/21                   Plan - 10/16/21 1742     Clinical Impression Statement Slight decrease in Achilles symptoms with gentle eccentric loading. Difficulty with progress in which partial thickness insertion tears of the central tendon may play a factor. Pt tolerated session well without aggravation of symptoms. Pt will benefit from continued skilled physical therapy services to decrease pain, improve strength and function.    Personal Factors and Comorbidities Comorbidity 1;Age;Fitness;Past/Current  Experience;Time since onset of injury/illness/exacerbation    Comorbidities arthritis    Examination-Activity  Limitations Stand;Lift;Locomotion Level;Carry;Squat;Stairs    Stability/Clinical Decision Making Stable/Uncomplicated   Pain is worsening per pt   Rehab Potential Fair    PT Frequency 2x / week    PT Duration 8 weeks    PT Treatment/Interventions Neuromuscular re-education;Gait training;Stair training;Functional mobility training;Therapeutic activities;Therapeutic exercise;Balance training;Iontophoresis 4mg /ml Dexamethasone;Electrical Stimulation;Aquatic Therapy;Biofeedback;Canalith Repostioning;Cryotherapy;Ultrasound;Patient/family education;Manual techniques;Dry needling;Vestibular;Spinal Manipulations;Joint Manipulations    PT Next Visit Plan Isometric, concentric, eccentric gastroc muscle activation, hip strengthening, femoral control, manual techniques, modalities PRN    PT Home Exercise Plan MedbridgeAccess Code [email or web address]    Consulted and Agree with Plan of Care Patient             Patient will benefit from skilled therapeutic intervention in order to improve the following deficits and impairments:  Pain, Improper body mechanics, Postural dysfunction, Difficulty walking, Decreased strength, Abnormal gait  Visit Diagnosis: Pain in right foot  Difficulty in walking, not elsewhere classified     Problem List Patient Active Problem List   Diagnosis Date Noted   Hyperlipidemia, unspecified 09/25/2021   Arthritis of hand 03/21/2021   Pain in finger of right hand 03/21/2021   OSA (obstructive sleep apnea) 07/18/2020   Uncontrolled morning headache 05/31/2020   Non-restorative sleep 05/31/2020   Nocturia more than twice per night 05/31/2020   Snoring 05/31/2020   Degenerative joint disease involving multiple joints 05/31/2020   Morbid obesity ([place]) 05/31/2020   Caffeine adverse reaction 05/31/2020   Fatigue 04/19/2020   Pain of both hip joints 12/09/2018   Bilateral hip bursitis 12/09/2018   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   Acquired trigger finger 09/03/2018   Tinnitus  07/06/2018   Acute pain of right wrist 04/13/2018   Leukocytosis 01/16/2018   Overactive bladder 01/16/2018   Pneumonia 09/15/2017   Other long term (current) drug therapy 05/28/2017   History of total knee replacement, bilateral 04/17/2016   Acute bronchitis 01/03/2016   Encounter for general adult medical examination without abnormal findings 11/28/2015   Gross hematuria 06/20/2015   Benign paroxysmal positional vertigo 12/05/2014   Irritable bowel syndrome 10/24/2014   Underimmunization status 07/12/2010   Allergic rhinitis 01/17/2010   Dyspnea 01/17/2010   Gastro-esophageal reflux disease without esophagitis 01/17/2010   Impaired fasting glucose 01/17/2010   Menopause 01/17/2010   Palpitations 01/17/2010   Rosacea 01/17/2010   Spondylosis without myelopathy 01/17/2010   Stress incontinence (female) (female) 01/17/2010    Joneen Boers PT, DPT   10/16/2021, 5:43 PM   [facility] [address], [place], [address] Phone: [phone]   Fax:  [phone]  Name: NYX KEADY MRN: [id number] Date of Birth: April 13, 1951

## 2021-10-18 ENCOUNTER — Ambulatory Visit: Payer: PPO

## 2021-10-18 DIAGNOSIS — M79671 Pain in right foot: Secondary | ICD-10-CM

## 2021-10-18 DIAGNOSIS — R262 Difficulty in walking, not elsewhere classified: Secondary | ICD-10-CM

## 2021-10-18 NOTE — Therapy (Signed)
[facility] [address] [place], [address] Phone: [phone]   Fax:  [phone]  Physical Therapy Treatment  Patient Details  Name: Caitlyn Manning MRN: [id number] Date of Birth: [birth year] Referring Provider (PT): [place], [place] T, [place]   Encounter Date: 10/18/2021   PT End of Session - 10/18/21 0931     Visit Number 6    Number of Visits 17    Date for PT Re-Evaluation 11/22/21    Authorization Type 6    Authorization Time Period 10    PT Start Time 0931    PT Stop Time 1003    PT Time Calculation (min) 32 min    Activity Tolerance Patient tolerated treatment well    Behavior During Therapy [facility] for tasks assessed/performed             Past Medical History:  Diagnosis Date   Allergy    seasonal   Arthritis    Cataract    GERD (gastroesophageal reflux disease)     Past Surgical History:  Procedure Laterality Date   KNEE SURGERY  2012   bilateral knee replacement    There were no vitals filed for this visit.   Subjective Assessment - 10/18/21 0932     Subjective R Achilles feels better this morning. Was in terrible pain after Tuesday's session. Did not do much yeasterday. Flares up when she is active. 0/10 R heel pain when ambulating from waiting room to treatment room.    Pertinent History R Achilles pain, small tear. Pt got a rescure this summer (dog) and the dog landed hard onto her R heel. Pain has worsened since onset. Pt has been sleeping with a boot since the end of September 2022. Has a history of R plantar fasciitis. Wears inserts at times which help with her plantar fasciitis.    Patient Stated Goals Just to be able to be up and active without being in pain.    Currently in Pain? No/denies    Pain Score 0-No pain    Pain Onset More than a month ago                                        PT Education - 10/18/21 1007     Education Details  ther-ex    [employer]) Educated Patient    Methods Explanation;Demonstration;Tactile cues;Verbal cues    Comprehension Verbalized understanding;Returned demonstration            Objective         No latex allergies No BP problems per pt.  No hx of osteoporosis.  No personal hx of CA   Pt states having vertigo that comes and goes. Also has B tinnitis.    (-) calf squeeze  R   R hip bursitis   MedbridgeAccess Code [email or web address]   Manual therapy   Seated STM R plantar foot to decrease fascial restrictions    Seated STM R gastroc/soleous muscles to decrease tension        Therapeutic exercise    Seated isometrics R ankle PF 1 min x 5 at 40% effort.   SLS with B UE light touch assist   R 30 seconds x 3   Standing mini squats 10x  Side stepping with green band around thighs   32 ft to the R and 32 ft to the L  Improved exercise technique, movement at target joints, use of target muscles after min to mod verbal, visual, tactile cues   .    Response to treatment Pt tolerated session well without aggravation of symptoms.      Clinical Impression Worked on decreasing soft tissue restrictions R plantar surface to promote posterior fascial train mobility. Also worked on proper loading to R Achilles to promote healing. Pt tolerated session well without aggravation of symptoms. Pt will benefit from continued skilled physical therapy services to decrease pain, improve strength and function.        PT Short Term Goals - 09/27/21 1258       PT SHORT TERM GOAL #1   Title Pt will be independent wiht her initial HEP to decrease pain, improve strength and ability to ambulate more comfortably.    Baseline Pt has started her HEP (09/27/2021)    Time 3    Period Weeks    Status New    Target Date 10/18/21               PT Long Term Goals - 09/27/21 1312       PT LONG TERM GOAL #1   Title Pt will have a decrease in R heel/Achilles pain to 2/10 or less at  worst to promote ability to ambulate, perform standing tasks more comfortably.    Baseline 6/10 at worst for the past 3 months (09/27/2021)    Time 8    Period Weeks    Status New    Target Date 11/22/21      PT LONG TERM GOAL #2   Title Patient will improve R ankle DF AROM to 10 degrees or more to promote ability to ambulate more comfortably.    Baseline R ankle DF AROM 3 degrees (09/27/2021)    Time 8    Period Weeks    Status New    Target Date 11/22/21      PT LONG TERM GOAL #3   Title Pt will improve bilateral hip extension and abduction strength by at least 1/2 MMT grade to promote ability to ambulate and perform weight bearing tasks more comfortably.    Baseline Hip extension 4/5 R and L, hip abduction 4/5 R and L (09/27/2021)    Time 8    Period Weeks    Status New    Target Date 11/22/21      PT LONG TERM GOAL #4   Title Pt will improve her R foot FOTO score by at least 10 points as a demonstration of improved function.    Baseline R foot FOTO 65 (09/27/2021)    Time 8    Period Weeks    Status New    Target Date 11/22/21                   Plan - 10/18/21 1006     Clinical Impression Statement Worked on decreasing soft tissue restrictions R plantar surface to promote posterior fascial train mobility. Also worked on proper loading to R Achilles to promote healing. Pt tolerated session well without aggravation of symptoms. Pt will benefit from continued skilled physical therapy services to decrease pain, improve strength and function.    Personal Factors and Comorbidities Comorbidity 1;Age;Fitness;Past/Current Experience;Time since onset of injury/illness/exacerbation    Comorbidities arthritis    Examination-Activity Limitations Stand;Lift;Locomotion Level;Carry;Squat;Stairs    Stability/Clinical Decision Making Stable/Uncomplicated   Pain is worsening per pt   Clinical Decision Making Low    Rehab Potential  Fair    PT Frequency 2x / week    PT Duration 8  weeks    PT Treatment/Interventions Neuromuscular re-education;Gait training;Stair training;Functional mobility training;Therapeutic activities;Therapeutic exercise;Balance training;Iontophoresis 4mg /ml Dexamethasone;Electrical Stimulation;Aquatic Therapy;Biofeedback;Canalith Repostioning;Cryotherapy;Ultrasound;Patient/family education;Manual techniques;Dry needling;Vestibular;Spinal Manipulations;Joint Manipulations    PT Next Visit Plan Isometric, concentric, eccentric gastroc muscle activation, hip strengthening, femoral control, manual techniques, modalities PRN    PT Home Exercise Plan MedbridgeAccess Code [email or web address]    Consulted and Agree with Plan of Care Patient             Patient will benefit from skilled therapeutic intervention in order to improve the following deficits and impairments:  Pain, Improper body mechanics, Postural dysfunction, Difficulty walking, Decreased strength, Abnormal gait  Visit Diagnosis: Pain in right foot  Difficulty in walking, not elsewhere classified     Problem List Patient Active Problem List   Diagnosis Date Noted   Hyperlipidemia, unspecified 09/25/2021   Arthritis of hand 03/21/2021   Pain in finger of right hand 03/21/2021   OSA (obstructive sleep apnea) 07/18/2020   Uncontrolled morning headache 05/31/2020   Non-restorative sleep 05/31/2020   Nocturia more than twice per night 05/31/2020   Snoring 05/31/2020   Degenerative joint disease involving multiple joints 05/31/2020   Morbid obesity ([place]) 05/31/2020   Caffeine adverse reaction 05/31/2020   Fatigue 04/19/2020   Pain of both hip joints 12/09/2018   Bilateral hip bursitis 12/09/2018   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   Acquired trigger finger 09/03/2018   Tinnitus 07/06/2018   Acute pain of right wrist 04/13/2018   Leukocytosis 01/16/2018   Overactive bladder 01/16/2018   Pneumonia 09/15/2017   Other long term (current) drug therapy 05/28/2017   History of total knee  replacement, bilateral 04/17/2016   Acute bronchitis 01/03/2016   Encounter for general adult medical examination without abnormal findings 11/28/2015   Gross hematuria 06/20/2015   Benign paroxysmal positional vertigo 12/05/2014   Irritable bowel syndrome 10/24/2014   Underimmunization status 07/12/2010   Allergic rhinitis 01/17/2010   Dyspnea 01/17/2010   Gastro-esophageal reflux disease without esophagitis 01/17/2010   Impaired fasting glucose 01/17/2010   Menopause 01/17/2010   Palpitations 01/17/2010   Rosacea 01/17/2010   Spondylosis without myelopathy 01/17/2010   Stress incontinence (female) (female) 01/17/2010    Joneen Boers PT, DPT   10/18/2021, 10:08 AM  [facility] [address], [place], [address] Phone: [phone]   Fax:  [phone]  Name: Caitlyn Manning MRN: [id number] Date of Birth: Dec 14, 1950

## 2021-10-22 ENCOUNTER — Ambulatory Visit: Payer: PPO

## 2021-10-22 DIAGNOSIS — M79671 Pain in right foot: Secondary | ICD-10-CM | POA: Diagnosis not present

## 2021-10-22 DIAGNOSIS — R262 Difficulty in walking, not elsewhere classified: Secondary | ICD-10-CM

## 2021-10-22 NOTE — Therapy (Signed)
[place] [facility] [address], [place], [address] Phone: [phone]   Fax:  [phone]  Physical Therapy Treatment  Patient Details  Name: Caitlyn Manning MRN: [id number] Date of Birth: 01-Nov-1950 Referring Provider (PT): [place], [place], [place]   Encounter Date: 10/22/2021   PT End of Session - 10/22/21 1149     Visit Number 7    Number of Visits 17    Date for PT Re-Evaluation 11/22/21    Authorization Type 7    Authorization Time Period 10    PT Start Time 1149    PT Stop Time 1239    PT Time Calculation (min) 50 min    Activity Tolerance Patient tolerated treatment well    Behavior During Therapy [facility] for tasks assessed/performed             Past Medical History:  Diagnosis Date   Allergy    seasonal   Arthritis    Cataract    GERD (gastroesophageal reflux disease)     Past Surgical History:  Procedure Laterality Date   KNEE SURGERY  2012   bilateral knee replacement    There were no vitals filed for this visit.   Subjective Assessment - 10/22/21 1150     Subjective R Achilles is ok. Just depends on what she does. Wants to make today as her last day. Does not feel like her heel is getting better. No pain currently. Was bothering her after last session by the time she got to the car and got home. Pain at worst is less frequent and the duration of the pain at worst is less.    Pertinent History R Achilles pain, small tear. Pt got a rescure this summer (dog) and the dog landed hard onto her R heel. Pain has worsened since onset. Pt has been sleeping with a boot since the end of September 2022. Has a history of R plantar fasciitis. Wears inserts at times which help with her plantar fasciitis.    Patient Stated Goals Just to be able to be up and active without being in pain.    Currently in Pain? No/denies    Pain Onset More than a month ago                [facility] PT Assessment - 10/22/21  0001       AROM   Lumbar Flexion WFL    Lumbar Extension limited    Lumbar - Right Side Bend limited    Lumbar - Left Side Bend limited    Lumbar - Right Rotation WFL    Lumbar - Left Rotation [facility]      Special Tests   Other special tests (-) repeated trunk flexion test                                    PT Education - 10/22/21 1316     Education Details ther-ex, progress    Person(s) Educated Patient    Methods Explanation;Demonstration;Verbal cues;Tactile cues    Comprehension Returned demonstration;Verbalized understanding           Objective         No latex allergies No BP problems per pt.  No hx of osteoporosis.  No personal hx of CA   Pt states having vertigo that comes and goes. Also has B tinnitis.    (-) calf  squeeze  R   R hip bursitis    MedbridgeAccess Code [email or web address]   Manual therapy    Supine STM R heel and plantar foot to decrease fascial restrictions       Therapeutic exercise   Sitting with R lateral shift posture: increased R heel ache, then eases off   Seated trunk flexion   No R heel ache but feels tension   Seated trunk flexion stretchy 30 seconds x 2  Seated manually resisted hip extension, S/L hip abduction   Hip extension 5/5 R, 5/5 L; hip abduction 4/5 R, 4/5 L  Supine ankle DF AROM   R 10 degrees  Supine eccentric PF with PT 10x  Supine R soleus stretch with PT 30 seconds   Secondary to decreased frequency and duration of pain at worst since starting PT reported by pt, pt was recommended to try to continue for a couple of more weeks to see if more improvement can occur. Pt can stop at any time. Pt agreable.       Improved exercise technique, movement at target joints, use of target muscles after min to mod verbal, visual, tactile cues   .    Response to treatment Fair tolerance to todays session     Clinical Impression Pt demonstrates decreased frequency and duration of R heel pain,  improved R ankle DF AROM as well as improved bilateral hip extension strength since initial evaluation. Pt making some progress with PT towards goals and was recommended to continue for a few more weeks to see if more progress can be achieved. Pt in agreement. Continued working on decreasing soft tissue restrictions to R heel and plantar surface as well as providing eccentric loading to affected tissues to promote healing. Fair tolerance to today's session.  Pt will benefit from continued skilled physical therapy services to decrease pain, improve strength and function.          PT Short Term Goals - 10/22/21 1154       PT SHORT TERM GOAL #1   Title Pt will be independent wiht her initial HEP to decrease pain, improve strength and ability to ambulate more comfortably.    Baseline Pt has started her HEP (09/27/2021); doing her home exercise, no questions (10/22/2021)    Time 3    Period Weeks    Status Achieved    Target Date 10/18/21               PT Long Term Goals - 10/22/21 1155       PT LONG TERM GOAL #1   Title Pt will have a decrease in R heel/Achilles pain to 2/10 or less at worst to promote ability to ambulate, perform standing tasks more comfortably.    Baseline 6/10 at worst for the past 3 months (09/27/2021); 3/10 to 7/10 periodically at most. Comes and goes. Pain can just be when she is sitting). Frequency and duration of pain is less (10/19/2021)    Time 8    Period Weeks    Status On-going    Target Date 11/22/21      PT LONG TERM GOAL #2   Title Patient will improve R ankle DF AROM to 10 degrees or more to promote ability to ambulate more comfortably.    Baseline R ankle DF AROM 3 degrees (09/27/2021); 10 degrees (10/22/2021)    Time 8    Period Weeks    Status Achieved    Target Date 11/22/21  PT LONG TERM GOAL #3   Title Pt will improve bilateral hip extension and abduction strength by at least 1/2 MMT grade to promote ability to ambulate and perform weight  bearing tasks more comfortably.    Baseline Hip extension 4/5 R and L, hip abduction 4/5 R and L (09/27/2021);Hip extension 5/5 R, 5/5 L; hip abduction 4/5 R, 4/5 L (10/22/2021)    Time 8    Period Weeks    Status Partially Met    Target Date 11/22/21      PT LONG TERM GOAL #4   Title Pt will improve her R foot FOTO score by at least 10 points as a demonstration of improved function.    Baseline R foot FOTO 65 (09/27/2021)    Time 8    Period Weeks    Status On-going    Target Date 11/22/21                   Plan - 10/22/21 1322     Clinical Impression Statement Pt demonstrates decreased frequency and duration of R heel pain, improved R ankle DF AROM as well as improved bilateral hip extension strength since initial evaluation. Pt making some progress with PT towards goals and was recommended to continue for a few more weeks to see if more progress can be achieved. Pt in agreement. Continued working on decreasing soft tissue restrictions to R heel and plantar surface as well as providing eccentric loading to affected tissues to promote healing. Fair tolerance to today's session.  Pt will benefit from continued skilled physical therapy services to decrease pain, improve strength and function.    Personal Factors and Comorbidities Comorbidity 1;Age;Fitness;Past/Current Experience;Time since onset of injury/illness/exacerbation    Comorbidities arthritis    Examination-Activity Limitations Stand;Lift;Locomotion Level;Carry;Squat;Stairs    Stability/Clinical Decision Making Stable/Uncomplicated   Pain is worsening per pt   Clinical Decision Making Low    Rehab Potential Fair    PT Frequency 2x / week    PT Duration 8 weeks    PT Treatment/Interventions Neuromuscular re-education;Gait training;Stair training;Functional mobility training;Therapeutic activities;Therapeutic exercise;Balance training;Iontophoresis 42m/ml Dexamethasone;Electrical Stimulation;Aquatic  Therapy;Biofeedback;Canalith Repostioning;Cryotherapy;Ultrasound;Patient/family education;Manual techniques;Dry needling;Vestibular;Spinal Manipulations;Joint Manipulations    PT Next Visit Plan Isometric, concentric, eccentric gastroc muscle activation, hip strengthening, femoral control, manual techniques, modalities PRN    PT Home Exercise Plan MedbridgeAccess Code [email or web address]    Consulted and Agree with Plan of Care Patient             Patient will benefit from skilled therapeutic intervention in order to improve the following deficits and impairments:  Pain, Improper body mechanics, Postural dysfunction, Difficulty walking, Decreased strength, Abnormal gait  Visit Diagnosis: Pain in right foot  Difficulty in walking, not elsewhere classified     Problem List Patient Active Problem List   Diagnosis Date Noted   Hyperlipidemia, unspecified 09/25/2021   Arthritis of hand 03/21/2021   Pain in finger of right hand 03/21/2021   OSA (obstructive sleep apnea) 07/18/2020   Uncontrolled morning headache 05/31/2020   Non-restorative sleep 05/31/2020   Nocturia more than twice per night 05/31/2020   Snoring 05/31/2020   Degenerative joint disease involving multiple joints 05/31/2020   [place]bid [place] 05/31/2020   Caffeine adverse reaction 05/31/2020   Fatigue 04/19/2020   Pain of both hip joints 12/09/2018   Bilateral hip bursitis 12/09/2018   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   Acquired trigger finger 09/03/2018   Tinnitus 07/06/2018   Acute pain of right wrist 04/13/2018  Leukocytosis 01/16/2018   Overactive bladder 01/16/2018   Pneumonia 09/15/2017   Other long term (current) drug therapy 05/28/2017   History of total knee replacement, bilateral 04/17/2016   Acute bronchitis 01/03/2016   Encounter for general adult medical examination without abnormal findings 11/28/2015   Gross hematuria 06/20/2015   Benign paroxysmal positional vertigo 12/05/2014   Irritable bowel  syndrome 10/24/2014   Underimmunization status 07/12/2010   Allergic rhinitis 01/17/2010   Dyspnea 01/17/2010   Gastro-esophageal reflux disease without esophagitis 01/17/2010   Impaired fasting glucose 01/17/2010   Menopause 01/17/2010   Palpitations 01/17/2010   Rosacea 01/17/2010   Spondylosis without myelopathy 01/17/2010   Stress incontinence (female) (female) 01/17/2010   Joneen Boers PT, DPT  10/22/2021, 1:27 PM  [place] [facility] [address] [place], [address] Phone: [phone]   Fax:  [phone]  Name: LUBERTA GRABINSKI MRN: [id number] Date of Birth: September 13, 1951

## 2021-10-22 NOTE — Patient Instructions (Signed)
Pt was recommended to massage her R heel and plantar foot to improve skin mobility prior to her eccentric T-band ankle exercise. Pt demonstrated and verbalized understanding.

## 2021-10-25 ENCOUNTER — Ambulatory Visit: Payer: PPO

## 2021-10-25 DIAGNOSIS — M79671 Pain in right foot: Secondary | ICD-10-CM | POA: Diagnosis not present

## 2021-10-25 DIAGNOSIS — R262 Difficulty in walking, not elsewhere classified: Secondary | ICD-10-CM

## 2021-10-25 NOTE — Therapy (Signed)
°[facility] °[address] °[place], [place], [address] °Phone: [phone]   Fax:  [phone] ° °Physical Therapy Treatment ° °Patient Details  °Name: Caitlyn Manning °MRN: [id number] °Date of Birth: 12/09/1950 °Referring Provider (PT): Hyatt, Max T, DPM ° ° °Encounter Date: 10/25/2021 ° ° PT End of Session - 10/25/21 1104   ° ° Visit Number 8   ° Number of Visits 17   ° Date for PT Re-Evaluation 11/22/21   ° Authorization Type 8   ° Authorization Time Period 10   ° PT Start Time 1104   ° PT Stop Time 1145   ° PT Time Calculation (min) 41 min   ° Activity Tolerance Patient tolerated treatment well   ° Behavior During Therapy WFL for tasks assessed/performed   ° °  °  ° °  ° ° °Past Medical History:  °Diagnosis Date  ° Allergy   ° seasonal  ° Arthritis   ° Cataract   ° GERD (gastroesophageal reflux disease)   ° ° °Past Surgical History:  °Procedure Laterality Date  ° KNEE SURGERY  2012  ° bilateral knee replacement  ° ° °There were no vitals filed for this visit. ° ° Subjective Assessment - 10/25/21 1105   ° ° Subjective R heel is doing pretty good. Was able to work on her leaves on the yard Tuesday. No pain currently. Back was irriated after last session but its not unusual.   ° Pertinent History R Achilles pain, small tear. Pt got a rescure this summer (dog) and the dog landed hard onto her R heel. Pain has worsened since onset. Pt has been sleeping with a boot since the end of September 2022. Has a history of R plantar fasciitis. Wears inserts at times which help with her plantar fasciitis.   ° Patient Stated Goals Just to be able to be up and active without being in pain.   ° Currently in Pain? No/denies   ° Pain Score 0-No pain   ° Pain Onset More than a month ago   ° °  °  ° °  ° ° ° ° ° ° ° ° ° ° ° ° ° ° ° ° ° ° ° ° ° ° ° ° ° ° ° ° ° PT Education - 10/25/21 1157   ° ° Education Details ther-ex   ° Person(s) Educated Patient   ° Methods  Explanation;Demonstration;Tactile cues;Verbal cues   ° Comprehension Returned demonstration;Verbalized understanding   ° °  °  ° °  ° °Objective   °  °  °  °No latex allergies °No BP problems per pt.  °No hx of osteoporosis.  °No personal hx of CA °  °Pt states having vertigo that comes and goes. Also has B tinnitis.  °  °(-) calf squeeze  R °  °R hip bursitis  °  °MedbridgeAccess Code [email or web address] °  °Manual therapy °  °L S/L   STM R heel with EDGE tool to decrease fascial restrictions ° °L S/L STM to R plantar foot to decrease fascial restrictions  °   °L S/L STM R gastroc muscle to decrease tension  ° °  °Therapeutic exercise °  °S/L eccentric PF with PT 10x3 °  °S/L R hip abduction with PT assist 5x3 ° ° °Improved exercise technique, movement at target joints, use of target muscles after min to mod verbal, visual, tactile cues °  ° °Response to treatment °Fair tolerance   to todays session °  °  °Clinical Impression °Possible improvement in symptoms based on subjective reports of being able to perform yard work such as cleaning up her leaves after last session. Continued working on decreasing soft tissue restrictions to R heel and plantar surface as well as providing eccentric loading to affected tissues to promote healing. Fair tolerance to today's session.  Pt will benefit from continued skilled physical therapy services to decrease pain, improve strength and function.  °  ° ° ° ° PT Short Term Goals - 10/22/21 1154   ° °  ° PT SHORT TERM GOAL #1  ° Title Pt will be independent wiht her initial HEP to decrease pain, improve strength and ability to ambulate more comfortably.   ° Baseline Pt has started her HEP (09/27/2021); doing her home exercise, no questions (10/22/2021)   ° Time 3   ° Period Weeks   ° Status Achieved   ° Target Date 10/18/21   ° °  °  ° °  ° ° ° ° PT Long Term Goals - 10/22/21 1155   ° °  ° PT LONG TERM GOAL #1  ° Title Pt will have a decrease in R heel/Achilles pain to 2/10 or less at worst to  promote ability to ambulate, perform standing tasks more comfortably.   ° Baseline 6/10 at worst for the past 3 months (09/27/2021); 3/10 to 7/10 periodically at most. Comes and goes. Pain can just be when she is sitting). Frequency and duration of pain is less (10/19/2021)   ° Time 8   ° Period Weeks   ° Status On-going   ° Target Date 11/22/21   °  ° PT LONG TERM GOAL #2  ° Title Patient will improve R ankle DF AROM to 10 degrees or more to promote ability to ambulate more comfortably.   ° Baseline R ankle DF AROM 3 degrees (09/27/2021); 10 degrees (10/22/2021)   ° Time 8   ° Period Weeks   ° Status Achieved   ° Target Date 11/22/21   °  ° PT LONG TERM GOAL #3  ° Title Pt will improve bilateral hip extension and abduction strength by at least 1/2 MMT grade to promote ability to ambulate and perform weight bearing tasks more comfortably.   ° Baseline Hip extension 4/5 R and L, hip abduction 4/5 R and L (09/27/2021);Hip extension 5/5 R, 5/5 L; hip abduction 4/5 R, 4/5 L (10/22/2021)   ° Time 8   ° Period Weeks   ° Status Partially Met   ° Target Date 11/22/21   °  ° PT LONG TERM GOAL #4  ° Title Pt will improve her R foot FOTO score by at least 10 points as a demonstration of improved function.   ° Baseline R foot FOTO 65 (09/27/2021)   ° Time 8   ° Period Weeks   ° Status On-going   ° Target Date 11/22/21   ° °  °  ° °  ° ° ° ° ° ° ° ° Plan - 10/25/21 1103   ° ° Clinical Impression Statement Possible improvement in symptoms based on subjective reports of being able to perform yard work such as cleaning up her leaves after last session. Continued working on decreasing soft tissue restrictions to R heel and plantar surface as well as providing eccentric loading to affected tissues to promote healing. Fair tolerance to today's session.  Pt will benefit from continued skilled physical therapy services to decrease   pain, improve strength and function.    Personal Factors and Comorbidities Comorbidity  1;Age;Fitness;Past/Current Experience;Time since onset of injury/illness/exacerbation    Comorbidities arthritis    Examination-Activity Limitations Stand;Lift;Locomotion Level;Carry;Squat;Stairs    Stability/Clinical Decision Making Stable/Uncomplicated   Pain is worsening per pt   Rehab Potential Fair    PT Frequency 2x / week    PT Duration 8 weeks    PT Treatment/Interventions Neuromuscular re-education;Gait training;Stair training;Functional mobility training;Therapeutic activities;Therapeutic exercise;Balance training;Iontophoresis 52m/ml Dexamethasone;Electrical Stimulation;Aquatic Therapy;Biofeedback;Canalith Repostioning;Cryotherapy;Ultrasound;Patient/family education;Manual techniques;Dry needling;Vestibular;Spinal Manipulations;Joint Manipulations    PT Next Visit Plan Isometric, concentric, eccentric gastroc muscle activation, hip strengthening, femoral control, manual techniques, modalities PRN    PT Home Exercise Plan MedbridgeAccess Code [email or web address]    Consulted and Agree with Plan of Care Patient             Patient will benefit from skilled therapeutic intervention in order to improve the following deficits and impairments:  Pain, Improper body mechanics, Postural dysfunction, Difficulty walking, Decreased strength, Abnormal gait  Visit Diagnosis: Pain in right foot  Difficulty in walking, not elsewhere classified     Problem List Patient Active Problem List   Diagnosis Date Noted   Hyperlipidemia, unspecified 09/25/2021   Arthritis of hand 03/21/2021   Pain in finger of right hand 03/21/2021   OSA (obstructive sleep apnea) 07/18/2020   Uncontrolled morning headache 05/31/2020   Non-restorative sleep 05/31/2020   Nocturia more than twice per night 05/31/2020   Snoring 05/31/2020   Degenerative joint disease involving multiple joints 05/31/2020   Morbid obesity ([place] 05/31/2020   Caffeine adverse reaction 05/31/2020   Fatigue 04/19/2020   Pain of both hip  joints 12/09/2018   Bilateral hip bursitis 12/09/2018   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   Acquired trigger finger 09/03/2018   Tinnitus 07/06/2018   Acute pain of right wrist 04/13/2018   Leukocytosis 01/16/2018   Overactive bladder 01/16/2018   Pneumonia 09/15/2017   Other long term (current) drug therapy 05/28/2017   History of total knee replacement, bilateral 04/17/2016   Acute bronchitis 01/03/2016   Encounter for general adult medical examination without abnormal findings 11/28/2015   Gross hematuria 06/20/2015   Benign paroxysmal positional vertigo 12/05/2014   Irritable bowel syndrome 10/24/2014   Underimmunization status 07/12/2010   Allergic rhinitis 01/17/2010   Dyspnea 01/17/2010   Gastro-esophageal reflux disease without esophagitis 01/17/2010   Impaired fasting glucose 01/17/2010   Menopause 01/17/2010   Palpitations 01/17/2010   Rosacea 01/17/2010   Spondylosis without myelopathy 01/17/2010   Stress incontinence (female) (female) 01/17/2010   MJoneen BoersPT, DPT  10/25/2021, 12:00 PM  [facility] [address] [place] [address]Phone: [phone]  Fax:  [phone] Name: VMAKINZY CLEEREMRN: [id number] of Birth: 803-18-1952

## 2021-10-29 ENCOUNTER — Ambulatory Visit: Payer: PPO

## 2021-10-31 ENCOUNTER — Ambulatory Visit: Payer: PPO

## 2021-10-31 DIAGNOSIS — R262 Difficulty in walking, not elsewhere classified: Secondary | ICD-10-CM

## 2021-10-31 DIAGNOSIS — M79671 Pain in right foot: Secondary | ICD-10-CM

## 2021-10-31 NOTE — Therapy (Signed)
[facility] [address] [place], [address] Phone: [phone]   Fax:  [phone]  Physical Therapy Treatment  Patient Details  Name: Caitlyn Manning MRN: [id number] Date of Birth: Apr 20, 1951 Referring Provider (PT): [place], [place] T, [place]   Encounter Date: 10/31/2021   PT End of Session - 10/31/21 1103     Visit Number 9    Number of Visits 17    Date for PT Re-Evaluation 11/22/21    Authorization Type 9    Authorization Time Period 10    PT Start Time 1103    PT Stop Time 1129    PT Time Calculation (min) 26 min    Activity Tolerance Patient tolerated treatment well    Behavior During Therapy [facility] for tasks assessed/performed             Past Medical History:  Diagnosis Date   Allergy    seasonal   Arthritis    Cataract    GERD (gastroesophageal reflux disease)     Past Surgical History:  Procedure Laterality Date   KNEE SURGERY  2012   bilateral knee replacement    There were no vitals filed for this visit.   Subjective Assessment - 10/31/21 1104     Subjective R foot is doing good. It has felt its best in the past couple of days. No pain currently.    Pertinent History R Achilles pain, small tear. Pt got a rescure this summer (dog) and the dog landed hard onto her R heel. Pain has worsened since onset. Pt has been sleeping with a boot since the end of September 2022. Has a history of R plantar fasciitis. Wears inserts at times which help with her plantar fasciitis.    Patient Stated Goals Just to be able to be up and active without being in pain.    Currently in Pain? No/denies    Pain Onset More than a month ago                                        PT Education - 10/31/21 1139     Education Details ther-ex    [employer]) Educated Patient    Methods Explanation;Demonstration;Tactile cues;Verbal cues    Comprehension Returned  demonstration;Verbalized understanding           Objective         No latex allergies No BP problems per pt.  No hx of osteoporosis.  No personal hx of CA   Pt states having vertigo that comes and goes. Also has B tinnitis.    (-) calf squeeze  R   R hip bursitis    MedbridgeAccess Code [email or web address]   Manual therapy    L S/L   STM R heel with EDGE tool to decrease fascial restrictions   L S/L STM to R plantar foot to decrease fascial restrictions     L S/L STM R gastroc muscle to decrease tension      Therapeutic exercise   S/L eccentric PF with PT 10x3   S/L R hip abduction with PT assist 5x3     Improved exercise technique, movement at target joints, use of target muscles after min to mod verbal, visual, tactile cues     Response to treatment Fair tolerance to todays session     Clinical Impression Pt is making very  good progress with decreased R heel pain based on subjective reports of it feeling the best it has been in the past couple of days. Continued working on decreasing soft tissue restrictions to R heel and plantar surface as well as providing eccentric loading to affected tissues to promote healing. Fair tolerance to today's session.  Pt will benefit from continued skilled physical therapy services to decrease pain, improve strength and function.      PT Short Term Goals - 10/22/21 1154       PT SHORT TERM GOAL #1   Title Pt will be independent wiht her initial HEP to decrease pain, improve strength and ability to ambulate more comfortably.    Baseline Pt has started her HEP (09/27/2021); doing her home exercise, no questions (10/22/2021)    Time 3    Period Weeks    Status Achieved    Target Date 10/18/21               PT Long Term Goals - 10/22/21 1155       PT LONG TERM GOAL #1   Title Pt will have a decrease in R heel/Achilles pain to 2/10 or less at worst to promote ability to ambulate, perform standing tasks more comfortably.     Baseline 6/10 at worst for the past 3 months (09/27/2021); 3/10 to 7/10 periodically at most. Comes and goes. Pain can just be when she is sitting). Frequency and duration of pain is less (10/19/2021)    Time 8    Period Weeks    Status On-going    Target Date 11/22/21      PT LONG TERM GOAL #2   Title Patient will improve R ankle DF AROM to 10 degrees or more to promote ability to ambulate more comfortably.    Baseline R ankle DF AROM 3 degrees (09/27/2021); 10 degrees (10/22/2021)    Time 8    Period Weeks    Status Achieved    Target Date 11/22/21      PT LONG TERM GOAL #3   Title Pt will improve bilateral hip extension and abduction strength by at least 1/2 MMT grade to promote ability to ambulate and perform weight bearing tasks more comfortably.    Baseline Hip extension 4/5 R and L, hip abduction 4/5 R and L (09/27/2021);Hip extension 5/5 R, 5/5 L; hip abduction 4/5 R, 4/5 L (10/22/2021)    Time 8    Period Weeks    Status Partially Met    Target Date 11/22/21      PT LONG TERM GOAL #4   Title Pt will improve her R foot FOTO score by at least 10 points as a demonstration of improved function.    Baseline R foot FOTO 65 (09/27/2021)    Time 8    Period Weeks    Status On-going    Target Date 11/22/21                   Plan - 10/31/21 1139     Clinical Impression Statement Pt is making very good progress with decreased R heel pain based on subjective reports of it feeling the best it has been in the past couple of days. Continued working on decreasing soft tissue restrictions to R heel and plantar surface as well as providing eccentric loading to affected tissues to promote healing. Fair tolerance to today's session.  Pt will benefit from continued skilled physical therapy services to decrease pain, improve strength and function.  Personal Factors and Comorbidities Comorbidity 1;Age;Fitness;Past/Current Experience;Time since onset of injury/illness/exacerbation     Comorbidities arthritis    Examination-Activity Limitations Stand;Lift;Locomotion Level;Carry;Squat;Stairs    Stability/Clinical Decision Making Stable/Uncomplicated   Pain is worsening per pt   Clinical Decision Making Low    Rehab Potential Fair    PT Frequency 2x / week    PT Duration 8 weeks    PT Treatment/Interventions Neuromuscular re-education;Gait training;Stair training;Functional mobility training;Therapeutic activities;Therapeutic exercise;Balance training;Iontophoresis 42m/ml Dexamethasone;Electrical Stimulation;Aquatic Therapy;Biofeedback;Canalith Repostioning;Cryotherapy;Ultrasound;Patient/family education;Manual techniques;Dry needling;Vestibular;Spinal Manipulations;Joint Manipulations    PT Next Visit Plan Isometric, concentric, eccentric gastroc muscle activation, hip strengthening, femoral control, manual techniques, modalities PRN    PT Home Exercise Plan MedbridgeAccess Code [email or web address]    Consulted and Agree with Plan of Care Patient             Patient will benefit from skilled therapeutic intervention in order to improve the following deficits and impairments:  Pain, Improper body mechanics, Postural dysfunction, Difficulty walking, Decreased strength, Abnormal gait  Visit Diagnosis: Pain in right foot  Difficulty in walking, not elsewhere classified     Problem List Patient Active Problem List   Diagnosis Date Noted   Hyperlipidemia, unspecified 09/25/2021   Arthritis of hand 03/21/2021   Pain in finger of right hand 03/21/2021   OSA (obstructive sleep apnea) 07/18/2020   Uncontrolled morning headache 05/31/2020   Non-restorative sleep 05/31/2020   Nocturia more than twice per night 05/31/2020   Snoring 05/31/2020   Degenerative joint disease involving multiple joints 05/31/2020   Morbid obesity ([place] 05/31/2020   Caffeine adverse reaction 05/31/2020   Fatigue 04/19/2020   Pain of both hip joints 12/09/2018   Bilateral hip bursitis 12/09/2018    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   Acquired trigger finger 09/03/2018   Tinnitus 07/06/2018   Acute pain of right wrist 04/13/2018   Leukocytosis 01/16/2018   Overactive bladder 01/16/2018   Pneumonia 09/15/2017   Other long term (current) drug therapy 05/28/2017   History of total knee replacement, bilateral 04/17/2016   Acute bronchitis 01/03/2016   Encounter for general adult medical examination without abnormal findings 11/28/2015   Gross hematuria 06/20/2015   Benign paroxysmal positional vertigo 12/05/2014   Irritable bowel syndrome 10/24/2014   Underimmunization status 07/12/2010   Allergic rhinitis 01/17/2010   Dyspnea 01/17/2010   Gastro-esophageal reflux disease without esophagitis 01/17/2010   Impaired fasting glucose 01/17/2010   Menopause 01/17/2010   Palpitations 01/17/2010   Rosacea 01/17/2010   Spondylosis without myelopathy 01/17/2010   Stress incontinence (female) (female) 01/17/2010   MJoneen BoersPT, DPT  .10/31/2021, 11:40 AM  [facility] [address] [place] [address]Phone: [phone]  Fax:  [phone] Name: VMARKEESHA CHARMRN: [id number] of Birth: [birth year]

## 2021-11-05 ENCOUNTER — Other Ambulatory Visit: Payer: Self-pay

## 2021-11-05 ENCOUNTER — Ambulatory Visit: Payer: PPO

## 2021-11-05 DIAGNOSIS — M79671 Pain in right foot: Secondary | ICD-10-CM | POA: Diagnosis not present

## 2021-11-05 DIAGNOSIS — R262 Difficulty in walking, not elsewhere classified: Secondary | ICD-10-CM

## 2021-11-05 NOTE — Therapy (Signed)
[facility] [address] [place], [address] Phone: [phone]   Fax:  [phone]  Physical Therapy Treatment/Physical Therapy Discharge Summary   Dates of reporting period  09/27/2022   to   11/05/2021  Patient Details  Name: Caitlyn Manning MRN: [id number] Date of Birth: [birth year] Referring Provider (PT): [place], [place] T, [place]   Encounter Date: 11/05/2021   PT End of Session - 11/05/21 1123     Visit Number 10    Number of Visits 17    Date for PT Re-Evaluation 11/22/21    Authorization Type 10    Authorization Time Period 10    PT Start Time 1101    PT Stop Time 1150    PT Time Calculation (min) 49 min    Activity Tolerance Patient tolerated treatment well    Behavior During Therapy [facility] for tasks assessed/performed             Past Medical History:  Diagnosis Date   Allergy    seasonal   Arthritis    Cataract    GERD (gastroesophageal reflux disease)     Past Surgical History:  Procedure Laterality Date   KNEE SURGERY  2012   bilateral knee replacement    There were no vitals filed for this visit.   Subjective Assessment - 11/05/21 1104     Subjective Pt endorses discomfort in R foot. Pleased with PT progress with STM techniques.    Pertinent History R Achilles pain, small tear. Pt got a rescure this summer (dog) and the dog landed hard onto her R heel. Pain has worsened since onset. Pt has been sleeping with a boot since the end of September 2022. Has a history of R plantar fasciitis. Wears inserts at times which help with her plantar fasciitis.    Patient Stated Goals Just to be able to be up and active without being in pain.    Currently in Pain? No/denies    Pain Onset More than a month ago              There.ex:   See clinical impression and long term goals for updates on goals.    Education on progressive HEP for d/c:   Seated resisted hip ER: GTB 1x10  Side  lying hip abduction for RLE  Standing hip abduction with UE support  Seated gastroc stretch with towel: 1x30 sec   Hand out provided with education on frequency, reps, sets.     Manual Therapy:   L side lying: STM to R gastroc for 25 min for decreased pain and improved gastroc mobility/muscle length    PT Education - 11/05/21 1241     Education Details form/technique with exercise. Glut and AROM exercises. Progress towards goals.    Person(s) Educated Patient    Methods Explanation;Demonstration;Handout    Comprehension Verbalized understanding;Returned demonstration              PT Short Term Goals - 10/22/21 1154       PT SHORT TERM GOAL #1   Title Pt will be independent wiht her initial HEP to decrease pain, improve strength and ability to ambulate more comfortably.    Baseline Pt has started her HEP (09/27/2021); doing her home exercise, no questions (10/22/2021)    Time 3    Period Weeks    Status Achieved    Target Date 10/18/21  PT Long Term Goals - 11/05/21 1124       PT LONG TERM GOAL #1   Title Pt will have a decrease in R heel/Achilles pain to 2/10 or less at worst to promote ability to ambulate, perform standing tasks more comfortably.    Baseline 6/10 at worst for the past 3 months (09/27/2021); 3/10 to 7/10 periodically at most. Comes and goes. Pain can just be when she is sitting). Frequency and duration of pain is less (10/19/2021); worst pain recorded at a 2/10 NPS. Has had one episode of sharp pain up to 5-6/10 NPS.    Time 8    Period Weeks    Status Achieved    Target Date 11/05/21      PT LONG TERM GOAL #2   Title Patient will improve R ankle DF AROM to 10 degrees or more to promote ability to ambulate more comfortably.    Baseline R ankle DF AROM 3 degrees (09/27/2021); 10 degrees (10/22/2021);    Time 8    Period Weeks    Status Achieved    Target Date 11/22/21      PT LONG TERM GOAL #3   Title Pt will improve bilateral hip  extension and abduction strength by at least 1/2 MMT grade to promote ability to ambulate and perform weight bearing tasks more comfortably.    Baseline Hip extension 4/5 R and L, hip abduction 4/5 R and L (09/27/2021);Hip extension 5/5 R, 5/5 L; hip abduction 4/5 R, 4/5 L (10/22/2021); 4/5 On R 4+/5 on L.    Time 8    Period Weeks    Status Partially Met    Target Date 11/22/21      PT LONG TERM GOAL #4   Title Pt will improve her R foot FOTO score by at least 10 points as a demonstration of improved function.    Baseline R foot FOTO 65 (09/27/2021); 11/05/20: 72    Time 8    Period Weeks    Status Achieved    Target Date 11/05/21                   Plan - 11/05/21 1244     Clinical Impression Statement Pt arrived to session with no pain. Progress note performed. Pt made great progress towards goals acchomplishing all short and long term goals except for R hip abduction MMT goal. After discussion with pt, pt comfortable with d/c. Pt educated on ankle DF stretch and progressive glut strengthening exercises with handout provided. PT POC completed.    Personal Factors and Comorbidities Comorbidity 1;Age;Fitness;Past/Current Experience;Time since onset of injury/illness/exacerbation    Comorbidities arthritis    Examination-Activity Limitations Stand;Lift;Locomotion Level;Carry;Squat;Stairs    Stability/Clinical Decision Making Stable/Uncomplicated   Pain is worsening per pt   Clinical Decision Making Low    Rehab Potential Fair    PT Frequency 2x / week    PT Duration 8 weeks    PT Treatment/Interventions Neuromuscular re-education;Gait training;Stair training;Functional mobility training;Therapeutic activities;Therapeutic exercise;Balance training;Iontophoresis 54m/ml Dexamethasone;Electrical Stimulation;Aquatic Therapy;Biofeedback;Canalith Repostioning;Cryotherapy;Ultrasound;Patient/family education;Manual techniques;Dry needling;Vestibular;Spinal Manipulations;Joint Manipulations     PT Next Visit Plan Isometric, concentric, eccentric gastroc muscle activation, hip strengthening, femoral control, manual techniques, modalities PRN    PT Home Exercise Plan MedbridgeAccess Code [email or web address]    Consulted and Agree with Plan of Care Patient             Patient will benefit from skilled therapeutic intervention in order to improve the following deficits and impairments:  Pain,  Improper body mechanics, Postural dysfunction, Difficulty walking, Decreased strength, Abnormal gait  Visit Diagnosis: Pain in right foot  Difficulty in walking, not elsewhere classified     Problem List Patient Active Problem List   Diagnosis Date Noted   Hyperlipidemia, unspecified 09/25/2021   Arthritis of hand 03/21/2021   Pain in finger of right hand 03/21/2021   OSA (obstructive sleep apnea) 07/18/2020   Uncontrolled morning headache 05/31/2020   Non-restorative sleep 05/31/2020   Nocturia more than twice per night 05/31/2020   Snoring 05/31/2020   Degenerative joint disease involving multiple joints 05/31/2020   Morbid obesity ([place]) 05/31/2020   Caffeine adverse reaction 05/31/2020   Fatigue 04/19/2020   Pain of both hip joints 12/09/2018   Bilateral hip bursitis 12/09/2018   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   Acquired trigger finger 09/03/2018   Tinnitus 07/06/2018   Acute pain of right wrist 04/13/2018   Leukocytosis 01/16/2018   Overactive bladder 01/16/2018   Pneumonia 09/15/2017   Other long term (current) drug therapy 05/28/2017   History of total knee replacement, bilateral 04/17/2016   Acute bronchitis 01/03/2016   Encounter for general adult medical examination without abnormal findings 11/28/2015   Gross hematuria 06/20/2015   Benign paroxysmal positional vertigo 12/05/2014   Irritable bowel syndrome 10/24/2014   Underimmunization status 07/12/2010   Allergic rhinitis 01/17/2010   Dyspnea 01/17/2010   Gastro-esophageal reflux disease without esophagitis  01/17/2010   Impaired fasting glucose 01/17/2010   Menopause 01/17/2010   Palpitations 01/17/2010   Rosacea 01/17/2010   Spondylosis without myelopathy 01/17/2010   Stress incontinence (female) (female) 01/17/2010    Salem Caster. Fairly IV, PT, DPT Physical Therapist- [facility]  11/05/2021, 12:55 PM  [facility] [address] [place], [address] Phone: [phone]   Fax:  [phone]  Name: Caitlyn Manning MRN: [id number] Date of Birth: 26-Nov-1950

## 2021-11-07 ENCOUNTER — Ambulatory Visit: Payer: PPO

## 2021-11-12 ENCOUNTER — Ambulatory Visit: Payer: PPO

## 2022-01-30 DIAGNOSIS — D72829 Elevated white blood cell count, unspecified: Secondary | ICD-10-CM | POA: Diagnosis not present

## 2022-01-30 DIAGNOSIS — K219 Gastro-esophageal reflux disease without esophagitis: Secondary | ICD-10-CM | POA: Diagnosis not present

## 2022-01-30 DIAGNOSIS — M179 Osteoarthritis of knee, unspecified: Secondary | ICD-10-CM | POA: Diagnosis not present

## 2022-01-30 DIAGNOSIS — R7301 Impaired fasting glucose: Secondary | ICD-10-CM | POA: Diagnosis not present

## 2022-01-30 DIAGNOSIS — H8110 Benign paroxysmal vertigo, unspecified ear: Secondary | ICD-10-CM | POA: Diagnosis not present

## 2022-02-07 DIAGNOSIS — Z1211 Encounter for screening for malignant neoplasm of colon: Secondary | ICD-10-CM | POA: Diagnosis not present

## 2022-03-19 DIAGNOSIS — Z87898 Personal history of other specified conditions: Secondary | ICD-10-CM | POA: Diagnosis not present

## 2022-03-19 DIAGNOSIS — H9313 Tinnitus, bilateral: Secondary | ICD-10-CM | POA: Diagnosis not present

## 2022-05-13 DIAGNOSIS — H25813 Combined forms of age-related cataract, bilateral: Secondary | ICD-10-CM | POA: Diagnosis not present

## 2022-07-02 DIAGNOSIS — R7301 Impaired fasting glucose: Secondary | ICD-10-CM | POA: Diagnosis not present

## 2022-07-02 DIAGNOSIS — E785 Hyperlipidemia, unspecified: Secondary | ICD-10-CM | POA: Diagnosis not present

## 2022-07-02 DIAGNOSIS — R5383 Other fatigue: Secondary | ICD-10-CM | POA: Diagnosis not present

## 2022-07-02 DIAGNOSIS — R7989 Other specified abnormal findings of blood chemistry: Secondary | ICD-10-CM | POA: Diagnosis not present

## 2022-07-09 DIAGNOSIS — R0609 Other forms of dyspnea: Secondary | ICD-10-CM | POA: Diagnosis not present

## 2022-07-09 DIAGNOSIS — R82998 Other abnormal findings in urine: Secondary | ICD-10-CM | POA: Diagnosis not present

## 2022-07-09 DIAGNOSIS — K219 Gastro-esophageal reflux disease without esophagitis: Secondary | ICD-10-CM | POA: Diagnosis not present

## 2022-07-09 DIAGNOSIS — N393 Stress incontinence (female) (male): Secondary | ICD-10-CM | POA: Diagnosis not present

## 2022-07-09 DIAGNOSIS — Z0000 Encounter for general adult medical examination without abnormal findings: Secondary | ICD-10-CM | POA: Diagnosis not present

## 2022-07-09 DIAGNOSIS — Z1389 Encounter for screening for other disorder: Secondary | ICD-10-CM | POA: Diagnosis not present

## 2022-07-09 DIAGNOSIS — E785 Hyperlipidemia, unspecified: Secondary | ICD-10-CM | POA: Diagnosis not present

## 2022-07-09 DIAGNOSIS — Z1331 Encounter for screening for depression: Secondary | ICD-10-CM | POA: Diagnosis not present

## 2022-07-09 DIAGNOSIS — E669 Obesity, unspecified: Secondary | ICD-10-CM | POA: Diagnosis not present

## 2022-07-09 DIAGNOSIS — R7301 Impaired fasting glucose: Secondary | ICD-10-CM | POA: Diagnosis not present

## 2022-07-23 DIAGNOSIS — Z23 Encounter for immunization: Secondary | ICD-10-CM | POA: Diagnosis not present

## 2022-10-03 DIAGNOSIS — Z1231 Encounter for screening mammogram for malignant neoplasm of breast: Secondary | ICD-10-CM | POA: Diagnosis not present

## 2022-11-04 DIAGNOSIS — M25512 Pain in left shoulder: Secondary | ICD-10-CM | POA: Diagnosis not present

## 2022-11-04 DIAGNOSIS — M25511 Pain in right shoulder: Secondary | ICD-10-CM | POA: Diagnosis not present

## 2023-01-13 DIAGNOSIS — R7301 Impaired fasting glucose: Secondary | ICD-10-CM | POA: Diagnosis not present

## 2023-03-11 NOTE — Progress Notes (Unsigned)
[facility] [address] [place] [address] Phone: [phone] Subjective:   Bruce Donath, am serving as a scribe for Dr. Antoine Primas.  I'm seeing this patient by the request  of:  Rodrigo Ran, MD  CC: bilateral thumb pain   [id number]  MADALYNE Manning is a 72 y.o. female coming in with complaint of B thumb pain. Patient has had pain for years. Pain is starting to radiate into her wrists. Denies any numbness or tingling.       Past Medical History:  Diagnosis Date   Allergy    seasonal   Arthritis    Cataract    GERD (gastroesophageal reflux disease)    Past Surgical History:  Procedure Laterality Date   KNEE SURGERY  2012   bilateral knee replacement   Social History   Socioeconomic History   Marital status: Widowed    Spouse name: Not on file   Number of children: Not on file   Years of education: Not on file   Highest education level: Not on file  Occupational History   Not on file  Tobacco Use   Smoking status: Never   Smokeless tobacco: Never  Substance and Sexual Activity   Alcohol use: No   Drug use: No   Sexual activity: Not on file  Other Topics Concern   Not on file  Social History Narrative   Not on file   Social Determinants of Health   Financial Resource Strain: Not on file  Food Insecurity: Not on file  Transportation Needs: Not on file  Physical Activity: Not on file  Stress: Not on file  Social Connections: Not on file   Allergies  Allergen Reactions   Omeprazole     Other reaction(s): failed to control sxs   Other    Tape    Latex Itching and Rash   Family History  Problem Relation Age of Onset   Liver cancer Father       Current Outpatient Medications (Respiratory):    montelukast (SINGULAIR) 10 MG tablet, Take 10 mg by mouth daily.   Triamcinolone Acetonide (NASACORT AQ NA), Place into the nose.  Current Outpatient Medications (Analgesics):    acetaminophen (TYLENOL)  500 MG tablet, Take 1,000 mg by mouth every 6 (six) hours as needed.     meloxicam (MOBIC) 15 MG tablet, meloxicam 15 mg tablet   meloxicam (MOBIC) 15 MG tablet, Take 1 tablet (15 mg total) by mouth daily.   Current Outpatient Medications (Other):    B COMPLEX-C PO, Take 1 tablet by mouth daily.   Cal Carb-Mag Hydrox-Simeth (ANTACID MULTI-SYMPTOM) 675-135-60 MG CHEW, Chew by mouth.   Calcium Carb-Cholecalciferol (CALCIUM 600+D) 600-800 MG-UNIT TABS, Take by mouth.   carboxymethylcellulose (REFRESH PLUS) 0.5 % SOLN, 1 drop 3 (three) times daily as needed.   Doxylamine Succinate, Sleep, (SLEEP AID PO), Take by mouth.   ELDERBERRY PO, Take by mouth.   Homeopathic Products (SINUS MEDICINE PO), Take by mouth.   Lancets (ONETOUCH DELICA PLUS LANCET30G) MISC, USE TO TEST BLOOD SUGARS ONCE DAILY (DX: ICD10: R73.01) 90 DAYS   Lysine 1000 MG TABS, Take by mouth.   Multiple Vitamins-Minerals (CENTRUM SILVER ULTRA WOMENS PO), Take 1 tablet by mouth daily.     MYRBETRIQ 50 MG TB24 tablet, Take 50 mg by mouth daily.   Olopatadine HCl (PATADAY OP), Apply to eye.   omeprazole (PRILOSEC) 20 MG capsule, Take 20 mg by mouth 2 (two) times daily.  valACYclovir (VALTREX) 1000 MG tablet, Take 2,000 mg by mouth 2 (two) times daily.     Zinc 50 MG TABS, Take 1 tablet by mouth daily.   Reviewed prior external information including notes and imaging from  primary care provider As well as notes that were available from care everywhere and other healthcare systems.  Past medical history, social, surgical and family history all reviewed in electronic medical record.  No pertanent information unless stated regarding to the chief complaint.   Review of Systems:  No headache, visual changes, nausea, vomiting, diarrhea, constipation, dizziness, abdominal pain, skin rash, fevers, chills, night sweats, weight loss, swollen lymph nodes, body aches, joint swelling, chest pain, shortness of breath, mood changes. POSITIVE  muscle aches  Objective  Blood pressure 120/72, pulse 66, height 5' 5.5" (1.664 m), weight 226 lb (102.5 kg), SpO2 97 %.   General: No apparent distress alert and oriented x3 mood and affect normal, dressed appropriately.  HEENT: Pupils equal, extraocular movements intact  Respiratory: Patient's speak in full sentences and does not appear short of breath  Cardiovascular: No lower extremity edema, non tender, no erythema  Bilateral hands do have CMC arthritic changes noted.  Patient does have a positive patellar grind testing.  Very mild thenar eminence wasting noted.   Procedure: Real-time Ultrasound Guided Injection of right CMC joint  Device: GE Logiq Q7 Ultrasound guided injection is preferred based studies that show increased duration, increased effect, greater accuracy, decreased procedural pain, increased response rate, and decreased cost with ultrasound guided versus blind injection.  Verbal informed consent obtained.  Time-out conducted.  Noted no overlying erythema, induration, or other signs of local infection.  Skin prepped in a sterile fashion.  Local anesthesia: Topical Ethyl chloride.  With sterile technique and under real time ultrasound guidance:  25 g  1/2 inch injected with 0.5 cc 0.5% Marcaine and 0.5 cc of Kenalog 40 mg per mL Completed without difficulty  Pain immediately resolved suggesting accurate placement of the medication.  Advised to call if fevers/chills, erythema, induration, drainage, or persistent bleeding.  Impression: Technically successful ultrasound guided injection.  Procedure: Real-time Ultrasound Guided Injection of left CMC joint Device: GE Logiq Q7 Ultrasound guided injection is preferred based studies that show increased duration, increased effect, greater accuracy, decreased procedural pain, increased response rate, and decreased cost with ultrasound guided versus blind injection.  Verbal informed consent obtained.  Time-out conducted.  Noted  no overlying erythema, induration, or other signs of local infection.  Skin prepped in a sterile fashion.  Local anesthesia: Topical Ethyl chloride.  With sterile technique and under real time ultrasound guidance: With a 25-gauge half inch needle injected with 0.5 cc of 0.5% Marcaine and 0.5 cc of Kenalog 40 mg/mL Completed without difficulty  Pain immediately resolved suggesting accurate placement of the medication.  Advised to call if fevers/chills, erythema, induration, drainage, or persistent bleeding.  Impression: Technically successful ultrasound guided injection.   Impression and Recommendations:     The above documentation has been reviewed and is accurate and complete Judi Saa, DO

## 2023-03-13 ENCOUNTER — Ambulatory Visit (INDEPENDENT_AMBULATORY_CARE_PROVIDER_SITE_OTHER): Payer: PPO | Admitting: Family Medicine

## 2023-03-13 ENCOUNTER — Encounter: Payer: Self-pay | Admitting: Family Medicine

## 2023-03-13 ENCOUNTER — Other Ambulatory Visit: Payer: Self-pay

## 2023-03-13 VITALS — BP 120/72 | HR 66 | Ht 65.5 in | Wt 226.0 lb

## 2023-03-13 DIAGNOSIS — M79644 Pain in right finger(s): Secondary | ICD-10-CM | POA: Diagnosis not present

## 2023-03-13 DIAGNOSIS — M79645 Pain in left finger(s): Secondary | ICD-10-CM

## 2023-03-13 DIAGNOSIS — M180 Bilateral primary osteoarthritis of first carpometacarpal joints: Secondary | ICD-10-CM

## 2023-03-13 NOTE — Patient Instructions (Signed)
Injection in both thumbs 2400mg  of tart cherry extract at bedtime See me in 2-3 months

## 2023-03-13 NOTE — Assessment & Plan Note (Signed)
Bilateral injections given today, tolerated the procedure well, discussed icing regimen and home exercises, discussed avoiding certain activities.  Follow-up with me again in 8 weeks.  May need to consider custom bracing.

## 2023-03-31 ENCOUNTER — Ambulatory Visit (INDEPENDENT_AMBULATORY_CARE_PROVIDER_SITE_OTHER): Payer: PPO | Admitting: Podiatry

## 2023-03-31 ENCOUNTER — Encounter: Payer: Self-pay | Admitting: Podiatry

## 2023-03-31 DIAGNOSIS — M778 Other enthesopathies, not elsewhere classified: Secondary | ICD-10-CM

## 2023-03-31 DIAGNOSIS — M19071 Primary osteoarthritis, right ankle and foot: Secondary | ICD-10-CM | POA: Diagnosis not present

## 2023-03-31 MED ORDER — TRIAMCINOLONE ACETONIDE 40 MG/ML IJ SUSP
20.0000 mg | Freq: Once | INTRAMUSCULAR | Status: AC
Start: 2023-03-31 — End: 2023-03-31
  Administered 2023-03-31: 20 mg

## 2023-03-31 NOTE — Progress Notes (Signed)
Follow-up she presents today for follow-up of arthritis of her first metatarsophalangeal joint right foot.  States that if he were to get worse he said that he could get a shot of medicine and I think about have to have that today.  Objective: Vital signs are stable alert and oriented x 3.  Right foot demonstrates strong palpable pulses with pain on attempted range of motion of the first metatarsophalangeal joint and palpable spurring and hypertrophy of the bone.  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  Plan: I injected the area today with 20 mg Kenalog 5 mg and Marcaine.  Tolerated procedure well will notify us with questions or concerns or when she is ready to have another injection or possible surgical intervention.

## 2023-05-14 NOTE — Progress Notes (Signed)
Caitlyn Manning [address] [place] [address] Phone: [phone] Subjective:   Caitlyn Manning, am serving as a scribe for Dr. Antoine Primas.  I'm seeing this patient by the request  of:  Caitlyn Ran, MD  CC: Bilateral thumb pain follow-up and hand pain  [id number]  03/13/2023 Bilateral injections given today, tolerated the procedure well, discussed icing regimen and home exercises, discussed avoiding certain activities. Follow-up with me again in 8 weeks. May need to consider custom bracing.   Updated 05/20/2023 Caitlyn Manning is a 72 y.o. female coming in with complaint of thumb pain. Doing so much better. Nothing new.      Past Medical History:  Diagnosis Date   Allergy    seasonal   Arthritis    Cataract    GERD (gastroesophageal reflux disease)    Past Surgical History:  Procedure Laterality Date   KNEE SURGERY  2012   bilateral knee replacement   Social History   Socioeconomic History   Marital status: Widowed    Spouse name: Not on file   Number of children: Not on file   Years of education: Not on file   Highest education level: Not on file  Occupational History   Not on file  Tobacco Use   Smoking status: Never   Smokeless tobacco: Never  Substance and Sexual Activity   Alcohol use: No   Drug use: No   Sexual activity: Not on file  Other Topics Concern   Not on file  Social History Narrative   Not on file   Social Determinants of Health   Financial Resource Strain: Not on file  Food Insecurity: Not on file  Transportation Needs: Not on file  Physical Activity: Not on file  Stress: Not on file  Social Connections: Not on file   Allergies  Allergen Reactions   Omeprazole     Other reaction(s): failed to control sxs   Other    Tape    Latex Itching and Rash   Family History  Problem Relation Age of Onset   Liver cancer Father       Current Outpatient Medications (Respiratory):     Triamcinolone Acetonide (NASACORT AQ NA), Place into the nose.  Current Outpatient Medications (Analgesics):    acetaminophen (TYLENOL) 500 MG tablet, Take 1,000 mg by mouth every 6 (six) hours as needed.     meloxicam (MOBIC) 15 MG tablet, Take 1 tablet (15 mg total) by mouth daily.   Current Outpatient Medications (Other):    B COMPLEX-C PO, Take 1 tablet by mouth daily.   Cal Carb-Mag Hydrox-Simeth (ANTACID MULTI-SYMPTOM) 675-135-60 MG CHEW, Chew by mouth.   Calcium Carb-Cholecalciferol (CALCIUM 600+D) 600-800 MG-UNIT TABS, Take by mouth.   Doxylamine Succinate, Sleep, (SLEEP AID PO), Take by mouth.   ELDERBERRY PO, Take by mouth.   Homeopathic Products (SINUS Manning PO), Take by mouth.   Lancets (ONETOUCH DELICA PLUS LANCET30G) MISC, USE TO TEST BLOOD SUGARS ONCE DAILY (DX: ICD10: R73.01) 90 DAYS   Lysine 1000 MG TABS, Take by mouth.   Multiple Vitamins-Minerals (CENTRUM SILVER ULTRA WOMENS PO), Take 1 tablet by mouth daily.     MYRBETRIQ 50 MG TB24 tablet, Take 50 mg by mouth daily.   omeprazole (PRILOSEC) 20 MG capsule, Take 20 mg by mouth 2 (two) times daily.   TART CHERRY PO, Take by mouth.   TURMERIC PO, Take by mouth.   Zinc 50 MG TABS, Take 1 tablet by mouth  daily.   Reviewed prior external information including notes and imaging from  primary care provider As well as notes that were available from care everywhere and other healthcare systems.  Past medical history, social, surgical and family history all reviewed in electronic medical record.  No pertanent information unless stated regarding to the chief complaint.   Review of Systems:  No headache, visual changes, nausea, vomiting, diarrhea, constipation, dizziness, abdominal pain, skin rash, fevers, chills, night sweats, weight loss, swollen lymph nodes, body aches, joint swelling, chest pain, shortness of breath, mood changes. POSITIVE muscle aches  Objective  Blood pressure 114/68, pulse 89, height 5' 5.5" (1.664  m), weight 230 lb (104.3 kg), SpO2 96%.   General: No apparent distress alert and oriented x3 mood and affect normal, dressed appropriately.  HEENT: Pupils equal, extraocular movements intact  Respiratory: Patient's speak in full sentences and does not appear short of breath  Cardiovascular: No lower extremity edema, non tender, no erythema  Hand exam shows the patient does have some arthritic changes noted of the [facility] joint but significant less pain than previous exam.  Fifth metacarpal does have significant arthritic changes noted.  Arthritic changes noted of the MCP and PIP joints.  Limited muscular skeletal ultrasound was performed and interpreted by Antoine Primas, M  Limited ultrasound shows resolution of the effusion noted of the [facility] joints bilaterally.  Patient does have what appears to be significant arthritic changes noted of the PIP and MCP of the right hand.  Of the fifth finger.   Impression and Recommendations:    The above documentation has been reviewed and is accurate and complete Caitlyn Saa, DO

## 2023-05-15 DIAGNOSIS — H02839 Dermatochalasis of unspecified eye, unspecified eyelid: Secondary | ICD-10-CM | POA: Diagnosis not present

## 2023-05-15 DIAGNOSIS — H25813 Combined forms of age-related cataract, bilateral: Secondary | ICD-10-CM | POA: Diagnosis not present

## 2023-05-20 ENCOUNTER — Encounter: Payer: Self-pay | Admitting: Family Medicine

## 2023-05-20 ENCOUNTER — Other Ambulatory Visit: Payer: Self-pay

## 2023-05-20 ENCOUNTER — Ambulatory Visit: Payer: PPO | Admitting: Family Medicine

## 2023-05-20 VITALS — BP 114/68 | HR 89 | Ht 65.5 in | Wt 230.0 lb

## 2023-05-20 DIAGNOSIS — M79644 Pain in right finger(s): Secondary | ICD-10-CM | POA: Diagnosis not present

## 2023-05-20 DIAGNOSIS — M79645 Pain in left finger(s): Secondary | ICD-10-CM

## 2023-05-20 NOTE — Patient Instructions (Addendum)
[employer] buddy tape with rep active  See you again in 3 months if needed

## 2023-05-22 ENCOUNTER — Ambulatory Visit: Payer: PPO | Admitting: Family Medicine

## 2023-06-26 DIAGNOSIS — R509 Fever, unspecified: Secondary | ICD-10-CM | POA: Diagnosis not present

## 2023-06-26 DIAGNOSIS — K219 Gastro-esophageal reflux disease without esophagitis: Secondary | ICD-10-CM | POA: Diagnosis not present

## 2023-06-26 DIAGNOSIS — J309 Allergic rhinitis, unspecified: Secondary | ICD-10-CM | POA: Diagnosis not present

## 2023-06-26 DIAGNOSIS — R0609 Other forms of dyspnea: Secondary | ICD-10-CM | POA: Diagnosis not present

## 2023-06-26 DIAGNOSIS — Z1152 Encounter for screening for COVID-19: Secondary | ICD-10-CM | POA: Diagnosis not present

## 2023-06-26 DIAGNOSIS — R051 Acute cough: Secondary | ICD-10-CM | POA: Diagnosis not present

## 2023-06-27 IMAGING — MR MR ANKLE*R* W/O CM
4 of 5 series · 13 of 40 positions shown · non-contrast
Comparison: Right foot x-ray 07/11/2021

CLINICAL DATA: Right heel pain

EXAM:
MRI OF THE RIGHT ANKLE WITHOUT CONTRAST
TECHNIQUE: Multiplanar, multisequence MR imaging of the ankle was performed. No
intravenous contrast was administered.

[Series 3: PD fat-sat · axial · right · 3.0mm · 0.25mm/px · z∈[-87,+13]mm · 4 of 30 slices shown]
[im 1/30]
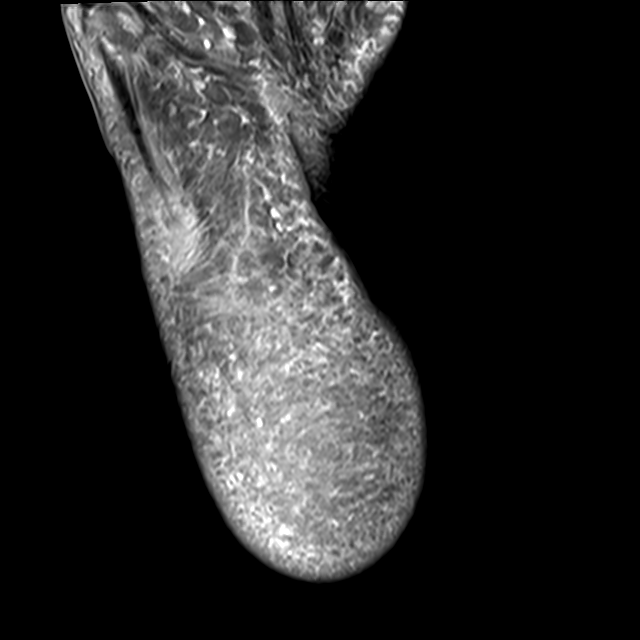
[im 4/30]
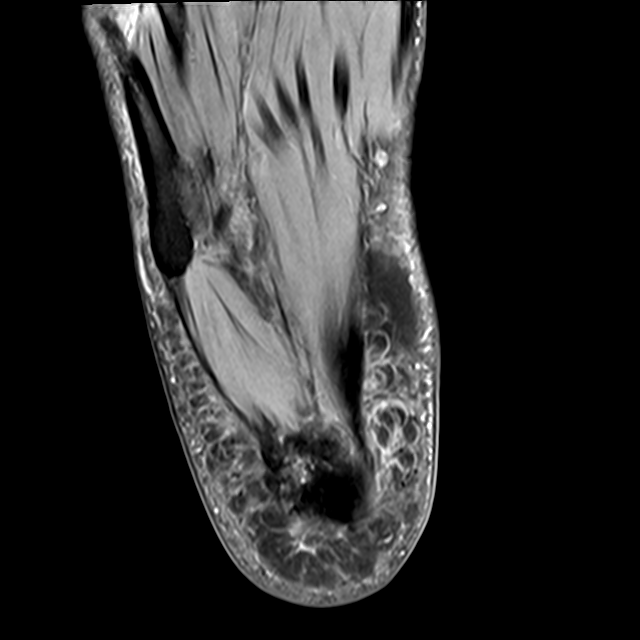
[im 15/30]
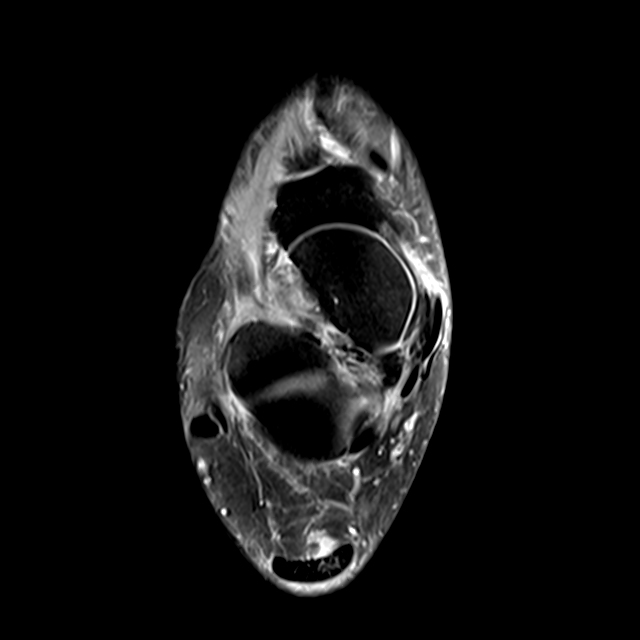
[im 26/30]
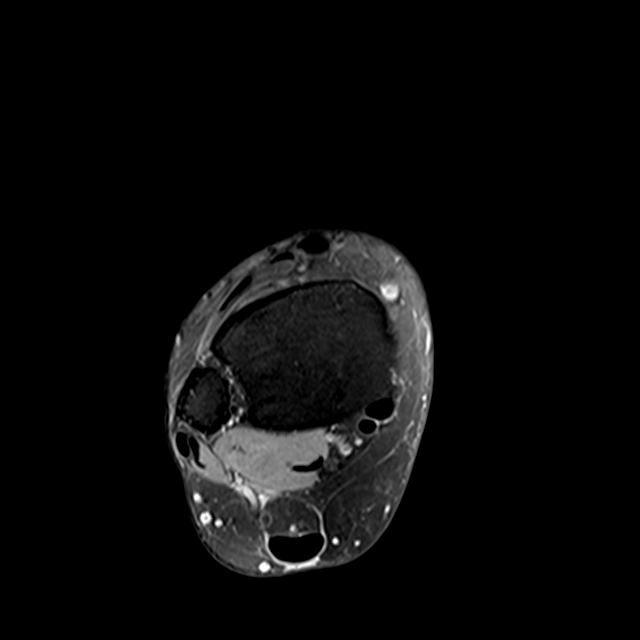

[Series 4: T2 fat-sat · axial · right · 3.0mm · 0.25mm/px · z∈[-75,+13]mm · 3 of 30 slices shown (1 of 2)]
[im 4/30]
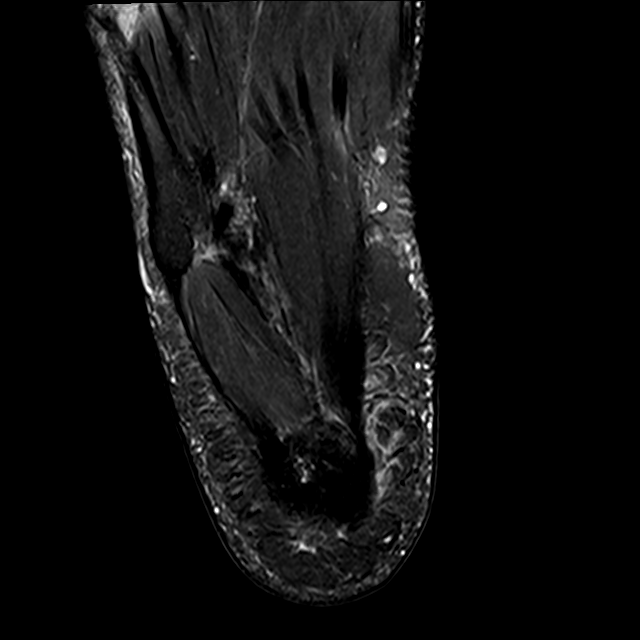
[im 15/30]
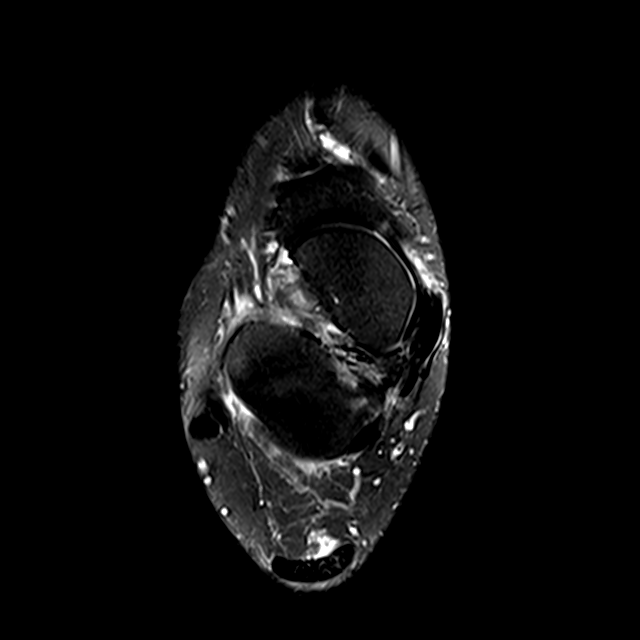
[im 26/30]
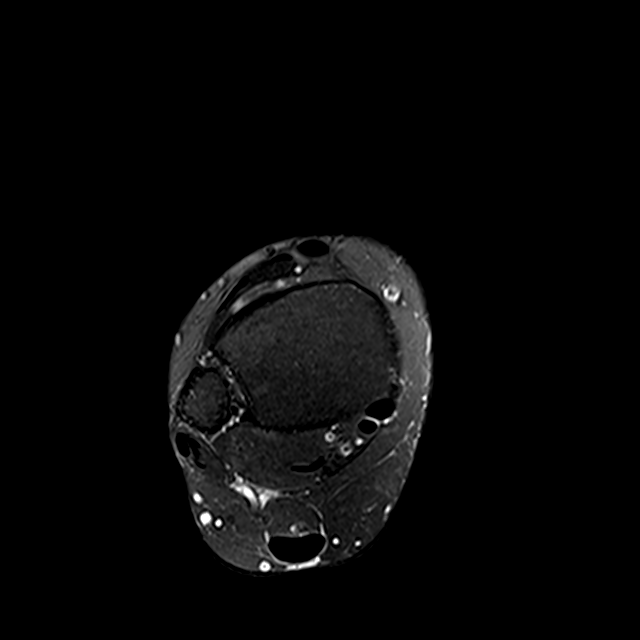

[Series 5: T1 · sagittal · right · 4.0mm · 0.27mm/px · 3 of 20 slices shown]
[im 4/20]
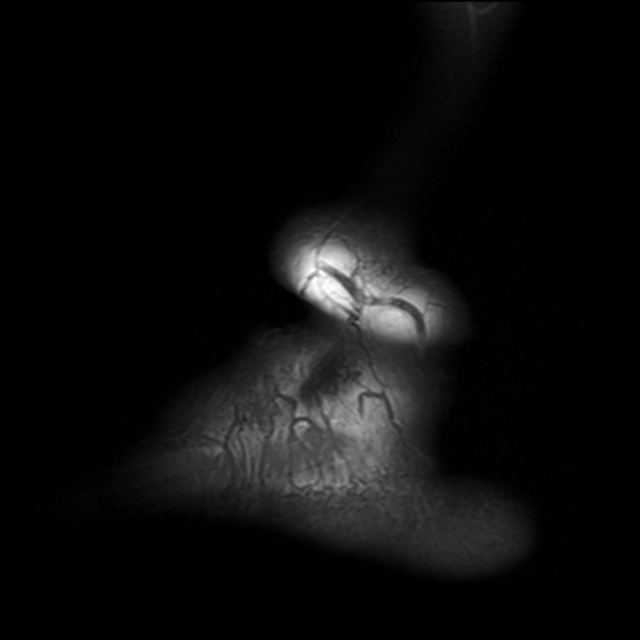
[im 12/20]
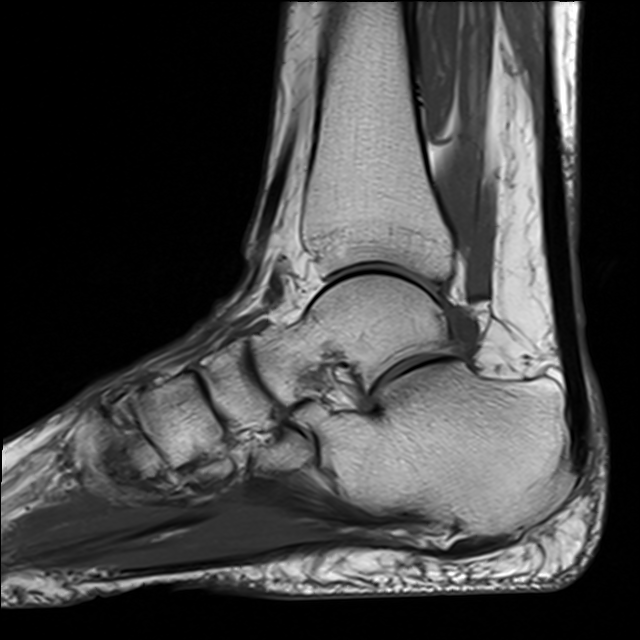
[im 20/20]
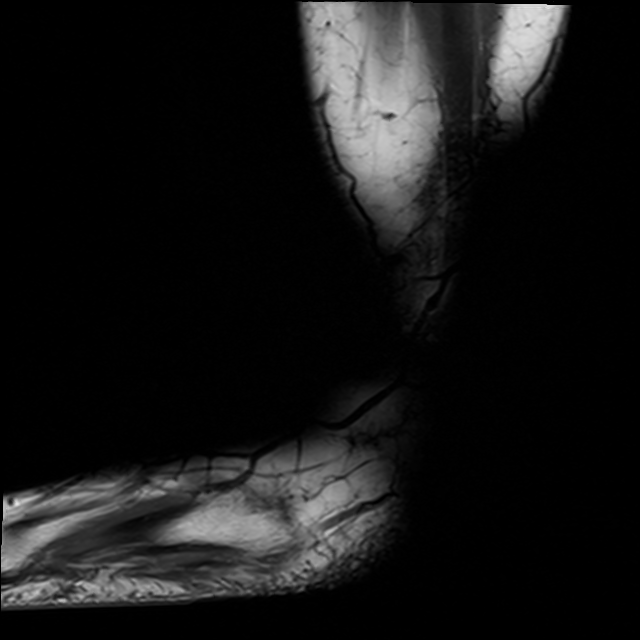

[Series 7: T2 fat-sat · coronal · right · 3.0mm · 0.25mm/px · 3 of 33 slices shown (2 of 2)]
[im 4/33]
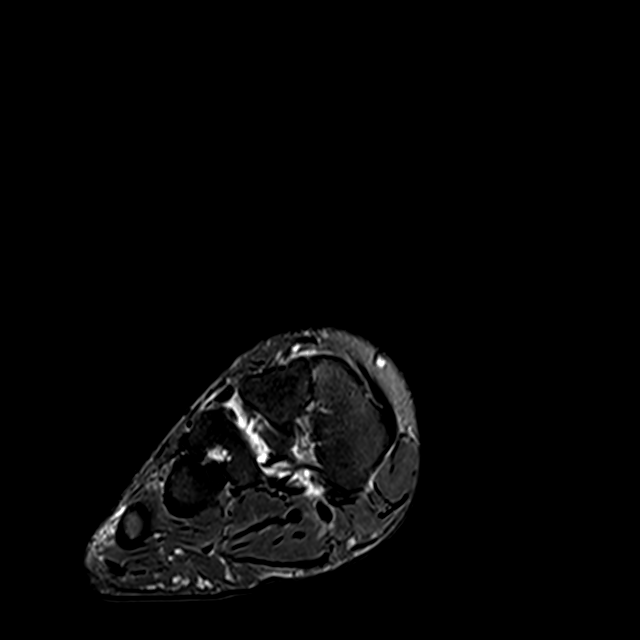
[im 18/33]
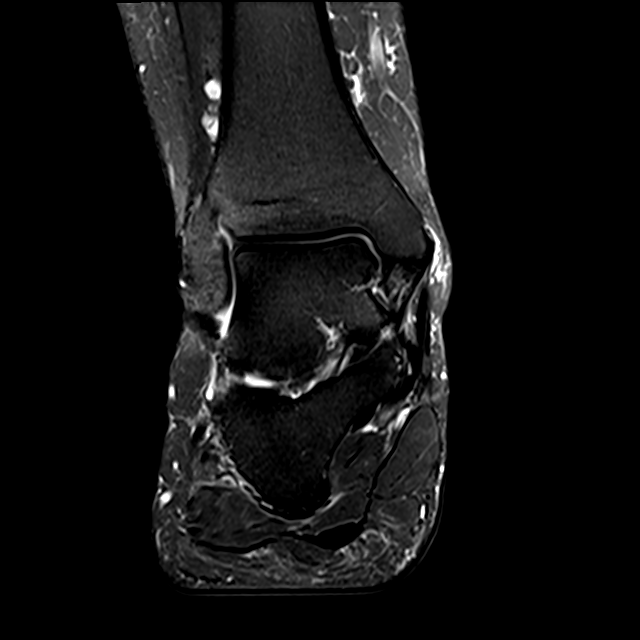
[im 29/33]
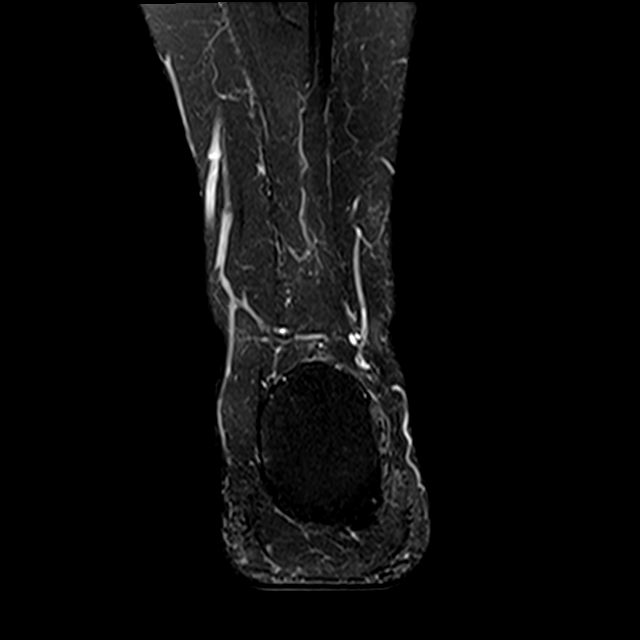

[13 of 40 positions shown; findings below may reference images not displayed]

FINDINGS: TENDONS

Peroneal: Tendinosis of the peroneus longus and peroneus brevis
tendons with short segment longitudinal split tear of the
infra-malleolar aspect of the peroneus brevis tendon.

Posteromedial: Intact tibialis posterior, flexor hallucis longus and
flexor digitorum longus tendons.

Anterior: Intact tibialis anterior, extensor hallucis longus and
extensor digitorum longus tendons.

Achilles: Mild tendinosis of the distal Achilles tendon with tiny
partial-thickness insertional tears of the central tendon (series 6,
images 9-10). No full-thickness or retracted tear. Small
calcifications are present within the distal tendon. Trace
peritendinitis distally.

Plantar Fascia: Intact.

LIGAMENTS

Lateral: The anterior and posterior tibiofibular ligaments are
intact. The anterior and posterior talofibular ligaments are intact.
Intact calcaneofibular ligament.

Medial: Deltoid ligament and spring ligament complex intact.

CARTILAGE

Ankle Joint: No joint effusion or chondral defect.

Subtalar Joints/Sinus Tarsi: No joint effusion or chondral defect.
Preservation of the anatomic fat within the sinus tarsi.

Bones: No acute fracture. No dislocation. Tiny bidirectional
calcaneal enthesophytes. Mild degenerative changes within the
midfoot and TMT joints. No bone marrow edema. No bone lesion.

Other: No fluid collections.
IMPRESSION: 1. Mild distal Achilles tendinosis with tiny partial-thickness
insertional tears of the central tendon. No full-thickness or
retracted tear.
2. Tendinosis of the peroneus longus and peroneus brevis tendons
with short segment longitudinal split tear of the infra-malleolar
aspect of the peroneus brevis tendon.
3. Mild degenerative changes within the midfoot and TMT joints.

## 2023-08-05 DIAGNOSIS — Z8616 Personal history of COVID-19: Secondary | ICD-10-CM | POA: Diagnosis not present

## 2023-08-05 DIAGNOSIS — J309 Allergic rhinitis, unspecified: Secondary | ICD-10-CM | POA: Diagnosis not present

## 2023-08-05 DIAGNOSIS — R058 Other specified cough: Secondary | ICD-10-CM | POA: Diagnosis not present

## 2023-08-05 DIAGNOSIS — J209 Acute bronchitis, unspecified: Secondary | ICD-10-CM | POA: Diagnosis not present

## 2023-08-11 DIAGNOSIS — E785 Hyperlipidemia, unspecified: Secondary | ICD-10-CM | POA: Diagnosis not present

## 2023-08-11 DIAGNOSIS — Z1389 Encounter for screening for other disorder: Secondary | ICD-10-CM | POA: Diagnosis not present

## 2023-08-11 DIAGNOSIS — R7301 Impaired fasting glucose: Secondary | ICD-10-CM | POA: Diagnosis not present

## 2023-08-11 DIAGNOSIS — Z1212 Encounter for screening for malignant neoplasm of rectum: Secondary | ICD-10-CM | POA: Diagnosis not present

## 2023-08-14 DIAGNOSIS — E785 Hyperlipidemia, unspecified: Secondary | ICD-10-CM | POA: Diagnosis not present

## 2023-08-14 DIAGNOSIS — Z1212 Encounter for screening for malignant neoplasm of rectum: Secondary | ICD-10-CM | POA: Diagnosis not present

## 2023-08-14 DIAGNOSIS — Z1389 Encounter for screening for other disorder: Secondary | ICD-10-CM | POA: Diagnosis not present

## 2023-08-14 DIAGNOSIS — R7301 Impaired fasting glucose: Secondary | ICD-10-CM | POA: Diagnosis not present

## 2023-08-18 DIAGNOSIS — L821 Other seborrheic keratosis: Secondary | ICD-10-CM | POA: Diagnosis not present

## 2023-08-18 DIAGNOSIS — D225 Melanocytic nevi of trunk: Secondary | ICD-10-CM | POA: Diagnosis not present

## 2023-08-18 DIAGNOSIS — L814 Other melanin hyperpigmentation: Secondary | ICD-10-CM | POA: Diagnosis not present

## 2023-08-18 DIAGNOSIS — M7130 Other bursal cyst, unspecified site: Secondary | ICD-10-CM | POA: Diagnosis not present

## 2023-08-18 DIAGNOSIS — D2262 Melanocytic nevi of left upper limb, including shoulder: Secondary | ICD-10-CM | POA: Diagnosis not present

## 2023-08-18 DIAGNOSIS — D692 Other nonthrombocytopenic purpura: Secondary | ICD-10-CM | POA: Diagnosis not present

## 2023-08-18 DIAGNOSIS — Z419 Encounter for procedure for purposes other than remedying health state, unspecified: Secondary | ICD-10-CM | POA: Diagnosis not present

## 2023-08-21 ENCOUNTER — Ambulatory Visit: Payer: PPO | Admitting: Family Medicine

## 2023-08-22 DIAGNOSIS — Z0000 Encounter for general adult medical examination without abnormal findings: Secondary | ICD-10-CM | POA: Diagnosis not present

## 2023-08-22 DIAGNOSIS — K219 Gastro-esophageal reflux disease without esophagitis: Secondary | ICD-10-CM | POA: Diagnosis not present

## 2023-08-22 DIAGNOSIS — Z23 Encounter for immunization: Secondary | ICD-10-CM | POA: Diagnosis not present

## 2023-08-22 DIAGNOSIS — R7301 Impaired fasting glucose: Secondary | ICD-10-CM | POA: Diagnosis not present

## 2023-08-22 DIAGNOSIS — E785 Hyperlipidemia, unspecified: Secondary | ICD-10-CM | POA: Diagnosis not present

## 2023-08-22 DIAGNOSIS — M179 Osteoarthritis of knee, unspecified: Secondary | ICD-10-CM | POA: Diagnosis not present

## 2023-08-22 DIAGNOSIS — K589 Irritable bowel syndrome without diarrhea: Secondary | ICD-10-CM | POA: Diagnosis not present

## 2023-08-22 DIAGNOSIS — E669 Obesity, unspecified: Secondary | ICD-10-CM | POA: Diagnosis not present

## 2023-08-22 DIAGNOSIS — R0609 Other forms of dyspnea: Secondary | ICD-10-CM | POA: Diagnosis not present

## 2023-08-22 DIAGNOSIS — R82998 Other abnormal findings in urine: Secondary | ICD-10-CM | POA: Diagnosis not present

## 2023-08-22 DIAGNOSIS — Z1339 Encounter for screening examination for other mental health and behavioral disorders: Secondary | ICD-10-CM | POA: Diagnosis not present

## 2023-08-22 DIAGNOSIS — M47819 Spondylosis without myelopathy or radiculopathy, site unspecified: Secondary | ICD-10-CM | POA: Diagnosis not present

## 2023-08-22 DIAGNOSIS — Z1331 Encounter for screening for depression: Secondary | ICD-10-CM | POA: Diagnosis not present

## 2023-10-10 DIAGNOSIS — Z1231 Encounter for screening mammogram for malignant neoplasm of breast: Secondary | ICD-10-CM | POA: Diagnosis not present

## 2023-10-13 DIAGNOSIS — J4531 Mild persistent asthma with (acute) exacerbation: Secondary | ICD-10-CM | POA: Diagnosis not present

## 2023-10-13 DIAGNOSIS — R062 Wheezing: Secondary | ICD-10-CM | POA: Diagnosis not present

## 2023-10-13 DIAGNOSIS — R058 Other specified cough: Secondary | ICD-10-CM | POA: Diagnosis not present

## 2023-10-24 DIAGNOSIS — E2839 Other primary ovarian failure: Secondary | ICD-10-CM | POA: Diagnosis not present

## 2023-10-24 DIAGNOSIS — N958 Other specified menopausal and perimenopausal disorders: Secondary | ICD-10-CM | POA: Diagnosis not present

## 2023-11-17 DIAGNOSIS — J309 Allergic rhinitis, unspecified: Secondary | ICD-10-CM | POA: Diagnosis not present

## 2023-11-17 DIAGNOSIS — K219 Gastro-esophageal reflux disease without esophagitis: Secondary | ICD-10-CM | POA: Diagnosis not present

## 2023-11-17 DIAGNOSIS — J4531 Mild persistent asthma with (acute) exacerbation: Secondary | ICD-10-CM | POA: Diagnosis not present

## 2023-11-20 ENCOUNTER — Ambulatory Visit: Payer: PPO | Admitting: Family Medicine

## 2023-12-18 NOTE — Progress Notes (Signed)
 [facility] [address] [place] [address] Phone: [phone] Subjective:   Bruce Donath, am serving as a scribe for Dr. Antoine Primas.  I'm seeing this patient by the request  of:  Rodrigo Ran, MD  CC: Bilateral thumb pain  [id number]  05/20/2023 [employer] buddy tape with rep active   See you again in 3 months if needed  Update 12/19/2023 Caitlyn Manning is a 73 y.o. female coming in with complaint of B thumb pain. Patient states that injections have been helpful. Would like to get injections today.  Starting affect daily activities and waking her up at night.  Also has some pain over R 3rd metacarpal over dorsal aspect.      Past Medical History:  Diagnosis Date   Allergy    seasonal   Arthritis    Cataract    GERD (gastroesophageal reflux disease)    Past Surgical History:  Procedure Laterality Date   KNEE SURGERY  2012   bilateral knee replacement   Social History   Socioeconomic History   Marital status: Widowed    Spouse name: Not on file   Number of children: Not on file   Years of education: Not on file   Highest education level: Not on file  Occupational History   Not on file  Tobacco Use   Smoking status: Never   Smokeless tobacco: Never  Substance and Sexual Activity   Alcohol use: No   Drug use: No   Sexual activity: Not on file  Other Topics Concern   Not on file  Social History Narrative   Not on file   Social Drivers of Health   Financial Resource Strain: Not on file  Food Insecurity: Not on file  Transportation Needs: Not on file  Physical Activity: Not on file  Stress: Not on file  Social Connections: Not on file   Allergies  Allergen Reactions   Omeprazole     Other reaction(s): failed to control sxs   Other    Tape    Latex Itching and Rash   Family History  Problem Relation Age of Onset   Liver cancer Father       Current Outpatient Medications (Respiratory):     Triamcinolone Acetonide (NASACORT AQ NA), Place into the nose.  Current Outpatient Medications (Analgesics):    acetaminophen (TYLENOL) 500 MG tablet, Take 1,000 mg by mouth every 6 (six) hours as needed.     meloxicam (MOBIC) 15 MG tablet, Take 1 tablet (15 mg total) by mouth daily.   Current Outpatient Medications (Other):    B COMPLEX-C PO, Take 1 tablet by mouth daily.   Cal Carb-Mag Hydrox-Simeth (ANTACID MULTI-SYMPTOM) 675-135-60 MG CHEW, Chew by mouth.   Calcium Carb-Cholecalciferol (CALCIUM 600+D) 600-800 MG-UNIT TABS, Take by mouth.   ELDERBERRY PO, Take by mouth.   Homeopathic Products (SINUS MEDICINE PO), Take by mouth.   Lancets (ONETOUCH DELICA PLUS LANCET30G) MISC, USE TO TEST BLOOD SUGARS ONCE DAILY (DX: ICD10: R73.01) 90 DAYS   Multiple Vitamins-Minerals (CENTRUM SILVER ULTRA WOMENS PO), Take 1 tablet by mouth daily.     MYRBETRIQ 50 MG TB24 tablet, Take 50 mg by mouth daily.   omeprazole (PRILOSEC) 20 MG capsule, Take 20 mg by mouth 2 (two) times daily.   TART CHERRY PO, Take by mouth.   TURMERIC PO, Take by mouth.   Zinc 50 MG TABS, Take 1 tablet by mouth daily.   Reviewed prior external information including notes  and imaging from  primary care provider As well as notes that were available from care everywhere and other healthcare systems.  Past medical history, social, surgical and family history all reviewed in electronic medical record.  No pertanent information unless stated regarding to the chief complaint.   Review of Systems:  No headache, visual changes, nausea, vomiting, diarrhea, constipation, dizziness, abdominal pain, skin rash, fevers, chills, night sweats, weight loss, swollen lymph nodes, body aches, joint swelling, chest pain, shortness of breath, mood changes. POSITIVE muscle aches  Objective  Blood pressure 138/84, pulse 83, height 5' 5.5" (1.664 m), weight 233 lb (105.7 kg), SpO2 97%.   General: No apparent distress alert and oriented x3 mood  and affect normal, dressed appropriately.  HEENT: Pupils equal, extraocular movements intact  Respiratory: Patient's speak in full sentences and does not appear short of breath  Bilateral hand exam shows the patient does have positive grind test of the [facility] joint.  Patient does have a good range of motion though still of the CMC joints.  Procedure: Real-time Ultrasound Guided Injection of right CMC joint Device: GE Logiq Q7 Ultrasound guided injection is preferred based studies that show increased duration, increased effect, greater accuracy, decreased procedural pain, increased response rate, and decreased cost with ultrasound guided versus blind injection.  Verbal informed consent obtained.  Time-out conducted.  Noted no overlying erythema, induration, or other signs of local infection.  Skin prepped in a sterile fashion.  Local anesthesia: Topical Ethyl chloride.  With sterile technique and under real time ultrasound guidance: With a 25-gauge half inch needle injected with 0.5 cc of 0.5% Marcaine and 0.5 cc of Kenalog 40 mg/mL Completed without difficulty  Pain immediately resolved suggesting accurate placement of the medication.  Advised to call if fevers/chills, erythema, induration, drainage, or persistent bleeding.  Images saved Impression: Technically successful ultrasound guided injection.  Procedure: Real-time Ultrasound Guided Injection of left CMC joint Device: GE Logiq Q7 Ultrasound guided injection is preferred based studies that show increased duration, increased effect, greater accuracy, decreased procedural pain, increased response rate, and decreased cost with ultrasound guided versus blind injection.  Verbal informed consent obtained.  Time-out conducted.  Noted no overlying erythema, induration, or other signs of local infection.  Skin prepped in a sterile fashion.  Local anesthesia: Topical Ethyl chloride.  With sterile technique and under real time ultrasound  guidance: With a 25-gauge half inch needle injected with 0.5 cc of 0.5% Marcaine and 0.5 cc of Kenalog 40 mg/mL Completed without difficulty  Pain immediately resolved suggesting accurate placement of the medication.  Advised to call if fevers/chills, erythema, induration, drainage, or persistent bleeding.  Imaging saved Impression: Technically successful ultrasound guided injection.   Impression and Recommendations:     The above documentation has been reviewed and is accurate and complete Judi Saa, DO

## 2023-12-19 ENCOUNTER — Ambulatory Visit: Payer: PPO | Admitting: Family Medicine

## 2023-12-19 ENCOUNTER — Other Ambulatory Visit: Payer: Self-pay

## 2023-12-19 ENCOUNTER — Encounter: Payer: Self-pay | Admitting: Family Medicine

## 2023-12-19 VITALS — BP 138/84 | HR 83 | Ht 65.5 in | Wt 233.0 lb

## 2023-12-19 DIAGNOSIS — M180 Bilateral primary osteoarthritis of first carpometacarpal joints: Secondary | ICD-10-CM

## 2023-12-19 DIAGNOSIS — M79644 Pain in right finger(s): Secondary | ICD-10-CM | POA: Diagnosis not present

## 2023-12-19 DIAGNOSIS — M79645 Pain in left finger(s): Secondary | ICD-10-CM | POA: Diagnosis not present

## 2023-12-19 NOTE — Assessment & Plan Note (Signed)
 Chronic problem with exacerbation.  Discussed icing regimen and home exercises, discussed which activities to do and which ones to avoid.  Increase activity slowly.  Discussed icing regimen.  Follow-up again in 6 to 8 weeks.

## 2023-12-19 NOTE — Patient Instructions (Signed)
 Injected both CMC joints Arnica lotion See me again in 3 months

## 2023-12-24 ENCOUNTER — Encounter: Payer: Self-pay | Admitting: Cardiovascular Disease

## 2023-12-24 ENCOUNTER — Ambulatory Visit: Payer: PPO | Attending: Cardiovascular Disease | Admitting: Cardiovascular Disease

## 2023-12-24 VITALS — BP 130/89 | HR 57 | Ht 64.75 in | Wt 228.8 lb

## 2023-12-24 DIAGNOSIS — R0609 Other forms of dyspnea: Secondary | ICD-10-CM | POA: Diagnosis not present

## 2023-12-24 DIAGNOSIS — R7303 Prediabetes: Secondary | ICD-10-CM

## 2023-12-24 DIAGNOSIS — G4710 Hypersomnia, unspecified: Secondary | ICD-10-CM

## 2023-12-24 DIAGNOSIS — E781 Pure hyperglyceridemia: Secondary | ICD-10-CM

## 2023-12-24 NOTE — H&P (View-Only) (Signed)
 Cardiology Office Note:    Date:  12/28/2023   ID:  Caitlyn Manning, DOB 07-01-1951, MRN [id number]  PCP:  Rodrigo Ran, MD   [facility] Providers Cardiologist:  Thurmon Fair, MD     Referring MD: Rodrigo Ran, MD   Chief Complaint  Patient presents with   Shortness of Breath  Caitlyn Manning is a 73 y.o. female who is being seen today for the evaluation of fatigue and shortness of breath at the request of Rodrigo Ran, MD.   History of Present Illness:    Caitlyn Manning is a 73 y.o. female with a hx of hypertriglyceridemia, prediabetes, osteoarthritis, mild persistent asthma, gastroesophageal reflux disease and benign positional vertigo who presents with complaints of generalized fatigue.  She reports that she has been "short of breath for years".  Symptoms have been worsening last 3 to 4 months ever since she had a COVID-19 infection in October.  Minimal activity will start a coughing spell, something as simple as preparing a meal.  Inhaler is definitely have helped the symptoms.  She also reports daytime hypersomnolence "if I sit down I fall asleep".  She has a history of a "borderline" sleep study in the past.  In 2022 at age 68 she had a very reassuring calcium score of 0.  A long time ago in 2010 she had a echocardiogram that showed LVEF 60% with mild LVH and "pseudonormalization and diastolic dysfunction" based on mitral inflow and tissue Doppler.  However, the actual data does not support this.  The E/a ratio was 1.13.  The annulus e' was 12 cm/s and the E/e' ratio was 5.6.  This would be consistent with normal diastolic function.  There was aortic valve sclerosis without stenosis but no other significant valvular abnormality.  Also in 2010 she had a normal nuclear stress test.  Her blood pressure is a little high today (diastolic blood pressure 94), but this is quite unusual for her.  Just recently her blood pressure was 138/84 and typically her blood  pressure is even lower than that.  Her most recent hemoglobin A1c was 6.2% last October.  Without any medications she has a good cholesterol profile with HDL 54, LDL 65 but with elevated triglycerides at 232.   She does not smoke.  Father had heart disease starting in his 59s; she believes he had a mild heart attack.  He lived to age 51.  Past Medical History:  Diagnosis Date   Allergy    seasonal   Arthritis    Cataract    GERD (gastroesophageal reflux disease)     Past Surgical History:  Procedure Laterality Date   KNEE SURGERY  2012   bilateral knee replacement    Current Medications: Current Meds  Medication Sig   acetaminophen (TYLENOL) 500 MG tablet Take 1,000 mg by mouth every 6 (six) hours as needed.     albuterol (VENTOLIN HFA) 108 (90 Base) MCG/ACT inhaler Inhale 2 puffs into the lungs every 6 (six) hours as needed for shortness of breath.   B COMPLEX-C PO Take 1 tablet by mouth daily.   BREO ELLIPTA 100-25 MCG/ACT AEPB Inhale 1 puff into the lungs daily.   Calcium Carb-Cholecalciferol (CALCIUM 600+D) 600-800 MG-UNIT TABS Take by mouth.   ELDERBERRY PO Take 1 capsule by mouth daily at 12 noon.   Homeopathic Products (SINUS MEDICINE PO) Take by mouth as needed.   Lancets (ONETOUCH DELICA PLUS LANCET30G) MISC USE TO TEST BLOOD SUGARS ONCE DAILY (DX:  ICD10: R73.01) 90 DAYS   meloxicam (MOBIC) 15 MG tablet Take 1 tablet (15 mg total) by mouth daily.   Multiple Vitamins-Minerals (CENTRUM SILVER ULTRA WOMENS PO) Take 1 tablet by mouth daily.     MYRBETRIQ 50 MG TB24 tablet Take 50 mg by mouth daily.   omeprazole (PRILOSEC) 20 MG capsule Take 20 mg by mouth daily.   TART CHERRY PO Take 1 capsule by mouth at bedtime.   Triamcinolone Acetonide (NASACORT AQ NA) Place into the nose.   TURMERIC PO Take 1 capsule by mouth at bedtime.   Zinc 50 MG TABS Take 1 tablet by mouth daily.     Allergies:   Other, Tape, and Latex   Family History: The patient's family history includes  Liver cancer in her father.  She believes he had a "mild heart attack" in his 28s.  ROS:   Please see the history of present illness.     All other systems reviewed and are negative.  EKGs/Labs/Other Studies Reviewed:    The following studies were reviewed today: Echocardiogram and nuclear stress test from 2010 Most recent labs from October 2024 EKG Interpretation Date/Time:  Wednesday December 24 2023 14:50:47 EDT Ventricular Rate:  57 PR Interval:  164 QRS Duration:  96 QT Interval:  428 QTC Calculation: 416 R Axis:   -13  Text Interpretation: Sinus bradycardia Incomplete right bundle branch block Moderate voltage criteria for LVH, may be normal variant ( R in aVL , Cornell product ) No previous ECGs available Confirmed by Areanna Gengler ([id number]) on 12/24/2023 3:03:41 PM   08/12/2023 Cholesterol 156, HDL 54, LDL 65, triglycerides 132 Hemoglobin A1c 6.2% Hemoglobin 13.4 Creatinine 0.81, potassium 4.5, ALT 18, TSH 0.815  Recent Labs: No results found for requested labs within last 365 days.  Recent Lipid Panel No results found for: "CHOL", "TRIG", "HDL", "CHOLHDL", "VLDL", "LDLCALC", "LDLDIRECT"   Risk Assessment/Calculations:           Physical Exam:    VS:  BP 130/89   Pulse (!) 57   Ht 5' 4.75" (1.645 m)   Wt 228 lb 12.8 oz (103.8 kg)   SpO2 96%   BMI 38.37 kg/m     Wt Readings from Last 3 Encounters:  12/24/23 228 lb 12.8 oz (103.8 kg)  12/19/23 233 lb (105.7 kg)  05/20/23 230 lb (104.3 kg)     GEN: Severely obese, well nourished, well developed in no acute distress HEENT: Normal NECK: No JVD; No carotid bruits LYMPHATICS: No lymphadenopathy CARDIAC: RRR, barely audible aortic ejection murmur, no diastolic murmurs, rubs, gallops RESPIRATORY:  Clear to auscultation without rales, wheezing or rhonchi  ABDOMEN: Soft, non-tender, non-distended MUSCULOSKELETAL:  No edema; No deformity  SKIN: Warm and dry NEUROLOGIC:  Alert and oriented x 3 PSYCHIATRIC:   Normal affect   ASSESSMENT:    1. Dyspnea on exertion   2. Hypertriglyceridemia   3. Prediabetes   4. Hypersomnolence   5. Severe obesity (BMI 35.0-39.9) with comorbidity (HCC)    PLAN:    In order of problems listed above:  Dyspnea on exertion: This sounds to be due to reactive airway disease since this started after a viral illness and improves with bronchodilators.  However remote echocardiogram was interpreted as showing diastolic dysfunction with elevated cardiac filling pressures.  On my review of the data I think this was a misinterpretation and that the study actually showed normal diastolic function.  I think we should clarify the situation with a follow-up echocardiogram. 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  To improve it, would recommend avoiding sugars and carbohydrates with high glycemic index and instead increasing the intake of lean protein, unsaturated fat and complex carbohydrates with low glycemic index.  Exercise would also be important.  All these methods should also help with weight loss. Hypersomnolence: Reports having a borderline sleep study in the past.  She might still benefit from CPAP or other intervention such as a dental device.  Weight loss of course would be most beneficial.  If we identify evidence of pulmonary hypertension or right heart enlargement/dysfunction on the echo, I would recommend repeating a sleep study. Obesity: Discussed above methods to help mitigate this.          Medication Adjustments/Labs and Tests Ordered: Current medicines are reviewed at length with the patient today.  Concerns regarding medicines are outlined above.  Orders Placed This Encounter  Procedures   EKG 12-Lead   ECHOCARDIOGRAM COMPLETE   No orders of the defined types were placed in this encounter.   Patient Instructions  Medication Instructions:  No changes.  *If you need a refill on your  cardiac medications before your next appointment, please call your pharmacy*  Testing/Procedures: Your physician has requested that you have an echocardiogram. Echocardiography is a painless test that uses sound waves to create images of your heart. It provides your doctor with information about the size and shape of your heart and how well your heart's chambers and valves are working. This procedure takes approximately one hour. There are no restrictions for this procedure. Please do NOT wear cologne, perfume, aftershave, or lotions (deodorant is allowed). Please arrive 15 minutes prior to your appointment time. [address].   Please note: We ask at that you not bring children with you during ultrasound (echo/ vascular) testing. Due to room size and safety concerns, children are not allowed in the ultrasound rooms during exams. Our front office staff cannot provide observation of children in our lobby area while testing is being conducted. An adult accompanying a patient to their appointment will only be allowed in the ultrasound room at the discretion of the ultrasound technician under special circumstances. We apologize for any inconvenience.    Follow-Up: At [facility], you and your health needs are our priority.  As part of our continuing mission to provide you with exceptional heart care, we have created designated Provider Care Teams.  These Care Teams include your primary Cardiologist (physician) and Advanced Practice Providers (APPs -  Physician Assistants and Nurse Practitioners) who all work together to provide you with the care you need, when you need it.  We recommend signing up for the patient portal called "MyChart".  Sign up information is provided on this After Visit Summary.  MyChart is used to connect with patients for Virtual Visits (Telemedicine).  Patients are able to view lab/test results, encounter notes, upcoming appointments, etc.  Non-urgent messages can be  sent to your provider as well.   To learn more about what you can do with MyChart, go to [email or web address].    Your next appointment:    As needed.   Provider:   Thurmon Fair, MD     Other Instructions         Signed, Thurmon Fair, MD  12/28/2023 8:30 PM    [facility]

## 2023-12-24 NOTE — Patient Instructions (Signed)
 Medication Instructions:  No changes.  *If you need a refill on your cardiac medications before your next appointment, please call your pharmacy*  Testing/Procedures: Your physician has requested that you have an echocardiogram. Echocardiography is a painless test that uses sound waves to create images of your heart. It provides your doctor with information about the size and shape of your heart and how well your heart's chambers and valves are working. This procedure takes approximately one hour. There are no restrictions for this procedure. Please do NOT wear cologne, perfume, aftershave, or lotions (deodorant is allowed). Please arrive 15 minutes prior to your appointment time. [address].   Please note: We ask at that you not bring children with you during ultrasound (echo/ vascular) testing. Due to room size and safety concerns, children are not allowed in the ultrasound rooms during exams. Our front office staff cannot provide observation of children in our lobby area while testing is being conducted. An adult accompanying a patient to their appointment will only be allowed in the ultrasound room at the discretion of the ultrasound technician under special circumstances. We apologize for any inconvenience.    Follow-Up: At [facility], you and your health needs are our priority.  As part of our continuing mission to provide you with exceptional heart care, we have created designated Provider Care Teams.  These Care Teams include your primary Cardiologist (physician) and Advanced Practice Providers (APPs -  Physician Assistants and Nurse Practitioners) who all work together to provide you with the care you need, when you need it.  We recommend signing up for the patient portal called "MyChart".  Sign up information is provided on this After Visit Summary.  MyChart is used to connect with patients for Virtual Visits (Telemedicine).  Patients are able to view lab/test results,  encounter notes, upcoming appointments, etc.  Non-urgent messages can be sent to your provider as well.   To learn more about what you can do with MyChart, go to [email or web address].    Your next appointment:    As needed.   Provider:   Thurmon Fair, MD     Other Instructions

## 2023-12-24 NOTE — Progress Notes (Signed)
 Cardiology Office Note:    Date:  12/28/2023   ID:  [name], DOB 07-01-1951, MRN [id number]  PCP:  Rodrigo Ran, MD   [facility] Providers Cardiologist:  Thurmon Fair, MD     Referring MD: Rodrigo Ran, MD   Chief Complaint  Patient presents with   Shortness of Breath  JAMIYAH Manning is a 73 y.o. female who is being seen today for the evaluation of fatigue and shortness of breath at the request of Rodrigo Ran, MD.   History of Present Illness:    Caitlyn Manning is a 73 y.o. female with a hx of hypertriglyceridemia, prediabetes, osteoarthritis, mild persistent asthma, gastroesophageal reflux disease and benign positional vertigo who presents with complaints of generalized fatigue.  She reports that she has been "short of breath for years".  Symptoms have been worsening last 3 to 4 months ever since she had a COVID-19 infection in October.  Minimal activity will start a coughing spell, something as simple as preparing a meal.  Inhaler is definitely have helped the symptoms.  She also reports daytime hypersomnolence "if I sit down I fall asleep".  She has a history of a "borderline" sleep study in the past.  In 2022 at age 68 she had a very reassuring calcium score of 0.  A long time ago in 2010 she had a echocardiogram that showed LVEF 60% with mild LVH and "pseudonormalization and diastolic dysfunction" based on mitral inflow and tissue Doppler.  However, the actual data does not support this.  The E/a ratio was 1.13.  The annulus e' was 12 cm/s and the E/e' ratio was 5.6.  This would be consistent with normal diastolic function.  There was aortic valve sclerosis without stenosis but no other significant valvular abnormality.  Also in 2010 she had a normal nuclear stress test.  Her blood pressure is a little high today (diastolic blood pressure 94), but this is quite unusual for her.  Just recently her blood pressure was 138/84 and typically her blood  pressure is even lower than that.  Her most recent hemoglobin A1c was 6.2% last October.  Without any medications she has a good cholesterol profile with HDL 54, LDL 65 but with elevated triglycerides at 232.   She does not smoke.  Father had heart disease starting in his 59s; she believes he had a mild heart attack.  He lived to age 51.  Past Medical History:  Diagnosis Date   Allergy    seasonal   Arthritis    Cataract    GERD (gastroesophageal reflux disease)     Past Surgical History:  Procedure Laterality Date   KNEE SURGERY  2012   bilateral knee replacement    Current Medications: Current Meds  Medication Sig   acetaminophen (TYLENOL) 500 MG tablet Take 1,000 mg by mouth every 6 (six) hours as needed.     albuterol (VENTOLIN HFA) 108 (90 Base) MCG/ACT inhaler Inhale 2 puffs into the lungs every 6 (six) hours as needed for shortness of breath.   B COMPLEX-C PO Take 1 tablet by mouth daily.   BREO ELLIPTA 100-25 MCG/ACT AEPB Inhale 1 puff into the lungs daily.   Calcium Carb-Cholecalciferol (CALCIUM 600+D) 600-800 MG-UNIT TABS Take by mouth.   ELDERBERRY PO Take 1 capsule by mouth daily at 12 noon.   Homeopathic Products (SINUS MEDICINE PO) Take by mouth as needed.   Lancets (ONETOUCH DELICA PLUS LANCET30G) MISC USE TO TEST BLOOD SUGARS ONCE DAILY (DX:  ICD10: R73.01) 90 DAYS   meloxicam (MOBIC) 15 MG tablet Take 1 tablet (15 mg total) by mouth daily.   Multiple Vitamins-Minerals (CENTRUM SILVER ULTRA WOMENS PO) Take 1 tablet by mouth daily.     MYRBETRIQ 50 MG TB24 tablet Take 50 mg by mouth daily.   omeprazole (PRILOSEC) 20 MG capsule Take 20 mg by mouth daily.   TART CHERRY PO Take 1 capsule by mouth at bedtime.   Triamcinolone Acetonide (NASACORT AQ NA) Place into the nose.   TURMERIC PO Take 1 capsule by mouth at bedtime.   Zinc 50 MG TABS Take 1 tablet by mouth daily.     Allergies:   Other, Tape, and Latex   Family History: The patient's family history includes  Liver cancer in her father.  She believes he had a "mild heart attack" in his 28s.  ROS:   Please see the history of present illness.     All other systems reviewed and are negative.  EKGs/Labs/Other Studies Reviewed:    The following studies were reviewed today: Echocardiogram and nuclear stress test from 2010 Most recent labs from October 2024 EKG Interpretation Date/Time:  Wednesday December 24 2023 14:50:47 EDT Ventricular Rate:  57 PR Interval:  164 QRS Duration:  96 QT Interval:  428 QTC Calculation: 416 R Axis:   -13  Text Interpretation: Sinus bradycardia Incomplete right bundle branch block Moderate voltage criteria for LVH, may be normal variant ( R in aVL , Cornell product ) No previous ECGs available Confirmed by Areanna Gengler ([id number]) on 12/24/2023 3:03:41 PM   08/12/2023 Cholesterol 156, HDL 54, LDL 65, triglycerides 132 Hemoglobin A1c 6.2% Hemoglobin 13.4 Creatinine 0.81, potassium 4.5, ALT 18, TSH 0.815  Recent Labs: No results found for requested labs within last 365 days.  Recent Lipid Panel No results found for: "CHOL", "TRIG", "HDL", "CHOLHDL", "VLDL", "LDLCALC", "LDLDIRECT"   Risk Assessment/Calculations:           Physical Exam:    VS:  BP 130/89   Pulse (!) 57   Ht 5' 4.75" (1.645 m)   Wt 228 lb 12.8 oz (103.8 kg)   SpO2 96%   BMI 38.37 kg/m     Wt Readings from Last 3 Encounters:  12/24/23 228 lb 12.8 oz (103.8 kg)  12/19/23 233 lb (105.7 kg)  05/20/23 230 lb (104.3 kg)     GEN: Severely obese, well nourished, well developed in no acute distress HEENT: Normal NECK: No JVD; No carotid bruits LYMPHATICS: No lymphadenopathy CARDIAC: RRR, barely audible aortic ejection murmur, no diastolic murmurs, rubs, gallops RESPIRATORY:  Clear to auscultation without rales, wheezing or rhonchi  ABDOMEN: Soft, non-tender, non-distended MUSCULOSKELETAL:  No edema; No deformity  SKIN: Warm and dry NEUROLOGIC:  Alert and oriented x 3 PSYCHIATRIC:   Normal affect   ASSESSMENT:    1. Dyspnea on exertion   2. Hypertriglyceridemia   3. Prediabetes   4. Hypersomnolence   5. Severe obesity (BMI 35.0-39.9) with comorbidity (HCC)    PLAN:    In order of problems listed above:  Dyspnea on exertion: This sounds to be due to reactive airway disease since this started after a viral illness and improves with bronchodilators.  However remote echocardiogram was interpreted as showing diastolic dysfunction with elevated cardiac filling pressures.  On my review of the data I think this was a misinterpretation and that the study actually showed normal diastolic function.  I think we should clarify the situation with a follow-up echocardiogram. 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  To improve it, would recommend avoiding sugars and carbohydrates with high glycemic index and instead increasing the intake of lean protein, unsaturated fat and complex carbohydrates with low glycemic index.  Exercise would also be important.  All these methods should also help with weight loss. Hypersomnolence: Reports having a borderline sleep study in the past.  She might still benefit from CPAP or other intervention such as a dental device.  Weight loss of course would be most beneficial.  If we identify evidence of pulmonary hypertension or right heart enlargement/dysfunction on the echo, I would recommend repeating a sleep study. Obesity: Discussed above methods to help mitigate this.          Medication Adjustments/Labs and Tests Ordered: Current medicines are reviewed at length with the patient today.  Concerns regarding medicines are outlined above.  Orders Placed This Encounter  Procedures   EKG 12-Lead   ECHOCARDIOGRAM COMPLETE   No orders of the defined types were placed in this encounter.   Patient Instructions  Medication Instructions:  No changes.  *If you need a refill on your  cardiac medications before your next appointment, please call your pharmacy*  Testing/Procedures: Your physician has requested that you have an echocardiogram. Echocardiography is a painless test that uses sound waves to create images of your heart. It provides your doctor with information about the size and shape of your heart and how well your heart's chambers and valves are working. This procedure takes approximately one hour. There are no restrictions for this procedure. Please do NOT wear cologne, perfume, aftershave, or lotions (deodorant is allowed). Please arrive 15 minutes prior to your appointment time. [address]   Please note: We ask at that you not bring children with you during ultrasound (echo/ vascular) testing. Due to room size and safety concerns, children are not allowed in the ultrasound rooms during exams. Our front office staff cannot provide observation of children in our lobby area while testing is being conducted. An adult accompanying a patient to their appointment will only be allowed in the ultrasound room at the discretion of the ultrasound technician under special circumstances. We apologize for any inconvenience.    Follow-Up: At [facility], you and your health needs are our priority.  As part of our continuing mission to provide you with exceptional heart care, we have created designated Provider Care Teams.  These Care Teams include your primary Cardiologist (physician) and Advanced Practice Providers (APPs -  Physician Assistants and Nurse Practitioners) who all work together to provide you with the care you need, when you need it.  We recommend signing up for the patient portal called "MyChart".  Sign up information is provided on this After Visit Summary.  MyChart is used to connect with patients for Virtual Visits (Telemedicine).  Patients are able to view lab/test results, encounter notes, upcoming appointments, etc.  Non-urgent messages can be  sent to your provider as well.   To learn more about what you can do with MyChart, go to [email or web address].    Your next appointment:    As needed.   Provider:   Thurmon Fair, MD     Other Instructions         Signed, Thurmon Fair, MD  12/28/2023 8:30 PM    [facility]

## 2023-12-31 ENCOUNTER — Encounter: Payer: Self-pay | Admitting: Podiatry

## 2023-12-31 ENCOUNTER — Ambulatory Visit: Admitting: Podiatry

## 2023-12-31 DIAGNOSIS — M19072 Primary osteoarthritis, left ankle and foot: Secondary | ICD-10-CM

## 2023-12-31 DIAGNOSIS — M19071 Primary osteoarthritis, right ankle and foot: Secondary | ICD-10-CM | POA: Diagnosis not present

## 2023-12-31 DIAGNOSIS — H43813 Vitreous degeneration, bilateral: Secondary | ICD-10-CM | POA: Diagnosis not present

## 2023-12-31 MED ORDER — TRIAMCINOLONE ACETONIDE 40 MG/ML IJ SUSP
40.0000 mg | Freq: Once | INTRAMUSCULAR | Status: AC
  Administered 2023-12-31: 40 mg

## 2023-12-31 NOTE — Progress Notes (Signed)
 She presents today after having not seen her since June of last year.  She is complaining for first metatarsophalangeal joints right greater than left.  States that she had a steroid flare left thumb the right 1 really hurt for a day or 2.  Objective: Vitals are stable alert oriented x 3 she has pain on range of motion passive particular end range of motion of the right first metatarsophalangeal joint lesser degree to the left.  Majority the pain is located over the dorsal and dorsal lateral aspect of the joint bilateral.  Assessment: Capsulitis of the first metatarsophalangeal joint osteoarthritis first metatarsophalangeal joint right greater than left.  Plan: Injected 10 mg Kenalog milligrams Marcaine point of maximal tenderness bilateral first metatarsal phalangeal joints Estrobene and skin prep.  Tolerated procedure well without complications.  Follow-up with her as needed.  She will follow-up with Dr. Eloy End for routine nail debridement.

## 2024-01-06 DIAGNOSIS — M25511 Pain in right shoulder: Secondary | ICD-10-CM | POA: Diagnosis not present

## 2024-01-06 DIAGNOSIS — M25512 Pain in left shoulder: Secondary | ICD-10-CM | POA: Diagnosis not present

## 2024-01-14 ENCOUNTER — Ambulatory Visit (HOSPITAL_COMMUNITY): Attending: Cardiology

## 2024-01-14 DIAGNOSIS — I340 Nonrheumatic mitral (valve) insufficiency: Secondary | ICD-10-CM | POA: Diagnosis not present

## 2024-01-14 DIAGNOSIS — R0609 Other forms of dyspnea: Secondary | ICD-10-CM | POA: Insufficient documentation

## 2024-01-14 LAB — ECHOCARDIOGRAM COMPLETE
Area-P 1/2: 3.42 cm2
S' Lateral: 3 cm

## 2024-01-15 ENCOUNTER — Encounter: Payer: Self-pay | Admitting: Cardiovascular Disease

## 2024-01-15 DIAGNOSIS — Z01812 Encounter for preprocedural laboratory examination: Secondary | ICD-10-CM

## 2024-01-15 NOTE — Telephone Encounter (Signed)
 Called the patient on the phone. Went over all instructions with the patient and also sent over MyChart. Pt will get lab work by 01/21/24- plans to come to our office to get labs done.   She verbalized understanding of all information.

## 2024-01-20 ENCOUNTER — Other Ambulatory Visit: Payer: Self-pay

## 2024-01-20 DIAGNOSIS — Z01812 Encounter for preprocedural laboratory examination: Secondary | ICD-10-CM | POA: Diagnosis not present

## 2024-01-21 ENCOUNTER — Encounter: Payer: Self-pay | Admitting: Cardiovascular Disease

## 2024-01-21 LAB — CBC
Hematocrit: 39.6 % (ref 34.0–46.6)
Hemoglobin: 12.7 g/dL (ref 11.1–15.9)
MCH: 27.1 pg (ref 26.6–33.0)
MCHC: 32.1 g/dL (ref 31.5–35.7)
MCV: 84 fL (ref 79–97)
Platelets: 368 10*3/uL (ref 150–450)
RBC: 4.69 x10E6/uL (ref 3.77–5.28)
RDW: 13.7 % (ref 11.7–15.4)
WBC: 7 10*3/uL (ref 3.4–10.8)

## 2024-01-21 LAB — BASIC METABOLIC PANEL WITH GFR
BUN/Creatinine Ratio: 14 (ref 12–28)
BUN: 12 mg/dL (ref 8–27)
CO2: 23 mmol/L (ref 20–29)
Calcium: 9.4 mg/dL (ref 8.7–10.3)
Chloride: 105 mmol/L (ref 96–106)
Creatinine, Ser: 0.83 mg/dL (ref 0.57–1.00)
Glucose: 105 mg/dL — ABNORMAL HIGH (ref 70–99)
Potassium: 4.2 mmol/L (ref 3.5–5.2)
Sodium: 143 mmol/L (ref 134–144)
eGFR: 75 mL/min/{1.73_m2} (ref >59)

## 2024-01-22 NOTE — Progress Notes (Signed)
 Spoke to patient and instructed them to come at 1100  and to be NPO after 0000.  Medications reviewed.    Confirmed that patient will have a ride home and someone to stay with them for 24 hours after the procedure.

## 2024-01-23 ENCOUNTER — Ambulatory Visit (HOSPITAL_COMMUNITY): Admitting: Anesthesiology

## 2024-01-23 ENCOUNTER — Ambulatory Visit (HOSPITAL_COMMUNITY)

## 2024-01-23 ENCOUNTER — Ambulatory Visit (HOSPITAL_COMMUNITY)
Admission: RE | Admit: 2024-01-23 | Discharge: 2024-01-23 | Disposition: A | Discharge status: Home / Self Care | Attending: Cardiovascular Disease | Admitting: Cardiovascular Disease

## 2024-01-23 ENCOUNTER — Other Ambulatory Visit: Payer: Self-pay

## 2024-01-23 ENCOUNTER — Ambulatory Visit: Admitting: Podiatry

## 2024-01-23 ENCOUNTER — Encounter (HOSPITAL_COMMUNITY): Payer: Self-pay | Admitting: Cardiovascular Disease

## 2024-01-23 ENCOUNTER — Encounter (HOSPITAL_COMMUNITY)
Admission: RE | Disposition: A | Discharge status: Home / Self Care | Payer: Self-pay | Source: Home / Self Care | Attending: Cardiovascular Disease

## 2024-01-23 DIAGNOSIS — G4733 Obstructive sleep apnea (adult) (pediatric): Secondary | ICD-10-CM

## 2024-01-23 DIAGNOSIS — E781 Pure hyperglyceridemia: Secondary | ICD-10-CM | POA: Insufficient documentation

## 2024-01-23 DIAGNOSIS — I340 Nonrheumatic mitral (valve) insufficiency: Secondary | ICD-10-CM

## 2024-01-23 DIAGNOSIS — Z79899 Other long term (current) drug therapy: Secondary | ICD-10-CM | POA: Diagnosis not present

## 2024-01-23 DIAGNOSIS — K219 Gastro-esophageal reflux disease without esophagitis: Secondary | ICD-10-CM | POA: Insufficient documentation

## 2024-01-23 DIAGNOSIS — R0609 Other forms of dyspnea: Secondary | ICD-10-CM | POA: Insufficient documentation

## 2024-01-23 DIAGNOSIS — I080 Rheumatic disorders of both mitral and aortic valves: Secondary | ICD-10-CM | POA: Diagnosis not present

## 2024-01-23 DIAGNOSIS — J449 Chronic obstructive pulmonary disease, unspecified: Secondary | ICD-10-CM

## 2024-01-23 DIAGNOSIS — E669 Obesity, unspecified: Secondary | ICD-10-CM | POA: Insufficient documentation

## 2024-01-23 DIAGNOSIS — R7303 Prediabetes: Secondary | ICD-10-CM | POA: Insufficient documentation

## 2024-01-23 DIAGNOSIS — I351 Nonrheumatic aortic (valve) insufficiency: Secondary | ICD-10-CM

## 2024-01-23 DIAGNOSIS — J4530 Mild persistent asthma, uncomplicated: Secondary | ICD-10-CM | POA: Insufficient documentation

## 2024-01-23 DIAGNOSIS — Z8249 Family history of ischemic heart disease and other diseases of the circulatory system: Secondary | ICD-10-CM | POA: Diagnosis not present

## 2024-01-23 DIAGNOSIS — R400 Somnolence: Secondary | ICD-10-CM | POA: Diagnosis not present

## 2024-01-23 DIAGNOSIS — Z6838 Body mass index (BMI) 38.0-38.9, adult: Secondary | ICD-10-CM | POA: Insufficient documentation

## 2024-01-23 DIAGNOSIS — G4730 Sleep apnea, unspecified: Secondary | ICD-10-CM | POA: Diagnosis not present

## 2024-01-23 HISTORY — PX: TRANSESOPHAGEAL ECHOCARDIOGRAM (CATH LAB): EP1270

## 2024-01-23 LAB — ECHO TEE
MV M vel: 6.22 m/s
MV Peak grad: 154.9 mmHg

## 2024-01-23 SURGERY — TRANSESOPHAGEAL ECHOCARDIOGRAM (TEE) (CATHLAB)
Anesthesia: Monitor Anesthesia Care

## 2024-01-23 MED ORDER — SODIUM CHLORIDE 0.9% FLUSH: 3.0000 mL | INTRAVENOUS | Status: DC | PRN

## 2024-01-23 MED ORDER — PROPOFOL 10 MG/ML IV BOLUS
INTRAVENOUS | Status: DC | PRN
  Administered 2024-01-23: 80 mg via INTRAVENOUS

## 2024-01-23 MED ORDER — SODIUM CHLORIDE 0.9 % IV SOLN: INTRAVENOUS | Status: DC | PRN

## 2024-01-23 MED ORDER — PROPOFOL 500 MG/50ML IV EMUL
INTRAVENOUS | Status: DC | PRN
  Administered 2024-01-23: 65 ug/kg/min via INTRAVENOUS

## 2024-01-23 MED ORDER — SODIUM CHLORIDE 0.9% FLUSH: 3.0000 mL | Freq: Two times a day (BID) | INTRAVENOUS | Status: DC

## 2024-01-23 MED ORDER — LIDOCAINE 2% (20 MG/ML) 5 ML SYRINGE
INTRAMUSCULAR | Status: DC | PRN
  Administered 2024-01-23: 40 mg via INTRAVENOUS

## 2024-01-23 NOTE — Anesthesia Preprocedure Evaluation (Addendum)
 Anesthesia Evaluation  Patient identified by MRN, date of birth, ID band Patient awake    Reviewed: Allergy & Precautions, NPO status , Patient's Chart, lab work & pertinent test results  History of Anesthesia Complications Negative for: history of anesthetic complications  Airway Mallampati: III  TM Distance: >3 FB Neck ROM: Full    Dental  (+) Dental Advisory Given,    Pulmonary shortness of breath and with exertion, sleep apnea (does not use CPAP) , neg COPD, neg recent URI   Pulmonary exam normal breath sounds clear to auscultation       Cardiovascular (-) hypertension(-) angina (-) Past MI, (-) Cardiac Stents and (-) CABG + dysrhythmias (incomplete RBBB) + Valvular Problems/Murmurs MR  Rhythm:Regular Rate:Normal  HLD  TTE 01/14/2024: IMPRESSIONS    1. Left ventricular ejection fraction, by estimation, is 60 to 65%. Left  ventricular ejection fraction by 3D volume is 64 %. The left ventricle has  normal function. The left ventricle has no regional wall motion  abnormalities. Left ventricular diastolic   parameters are consistent with Grade II diastolic dysfunction  (pseudonormalization).   2. Right ventricular systolic function is normal. The right ventricular  size is normal.   3. The mitral valve is normal in structure. Moderate mitral valve  regurgitation. No evidence of mitral stenosis.   4. The aortic valve is normal in structure. Aortic valve regurgitation is  not visualized. No aortic stenosis is present.   5. The inferior vena cava is normal in size with greater than 50%  respiratory variability, suggesting right atrial pressure of 3 mmHg.   CAC 0 on 06/11/2021   Neuro/Psych neg Seizures (spondylosis) BPPV  Neuromuscular disease    GI/Hepatic Neg liver ROS,GERD  Medicated,,IBS   Endo/Other  negative endocrine ROS    Renal/GU negative Renal ROS     Musculoskeletal  (+) Arthritis ,    Abdominal    Peds  Hematology negative hematology ROS (+) Lab Results      Component                Value               Date                      WBC                      7.0                 01/20/2024                HGB                      12.7                01/20/2024                HCT                      39.6                01/20/2024                MCV                      84                  01/20/2024  PLT                      368                 01/20/2024              Anesthesia Other Findings   Reproductive/Obstetrics                             Anesthesia Physical Anesthesia Plan  ASA: 3  Anesthesia Plan: MAC   Post-op Pain Management:    Induction: Intravenous  PONV Risk Score and Plan: 2 and Propofol infusion, TIVA and Treatment may vary due to age or medical condition  Airway Management Planned: Natural Airway and Nasal Cannula  Additional Equipment:   Intra-op Plan:   Post-operative Plan:   Informed Consent: I have reviewed the patients History and Physical, chart, labs and discussed the procedure including the risks, benefits and alternatives for the proposed anesthesia with the patient or authorized representative who has indicated his/her understanding and acceptance.     Dental advisory given  Plan Discussed with: CRNA and Anesthesiologist  Anesthesia Plan Comments: (Discussed with patient risks of MAC including, but not limited to, minor pain or discomfort, hearing people in the room, and possible need for backup general anesthesia. Risks for general anesthesia also discussed including, but not limited to, sore throat, hoarse voice, chipped/damaged teeth, injury to vocal cords, nausea and vomiting, allergic reactions, lung infection, heart attack, stroke, and death. All questions answered. )        Anesthesia Quick Evaluation

## 2024-01-23 NOTE — Interval H&P Note (Signed)
 History and Physical Interval Note:  01/23/2024 11:05 AM Interval transthoracic echo shows unexpected degree of mitral insufficiency, that we plan to evaluate in more detail by TEE today. Caitlyn Manning  has presented today for surgery, with the diagnosis of MITRAL VALVE REGURGITATION.  The various methods of treatment have been discussed with the patient and family. After consideration of risks, benefits and other options for treatment, the patient has consented to  Procedure(s): TRANSESOPHAGEAL ECHOCARDIOGRAM (N/A) as a surgical intervention.  The patient's history has been reviewed, patient examined, no change in status, stable for surgery.  I have reviewed the patient's chart and labs.  Questions were answered to the patient's satisfaction.     Roxy Mastandrea

## 2024-01-23 NOTE — Anesthesia Postprocedure Evaluation (Signed)
 Anesthesia Post Note  Patient: Caitlyn Manning  Procedure(s) Performed: TRANSESOPHAGEAL ECHOCARDIOGRAM     Patient location during evaluation: PACU Anesthesia Type: MAC Level of consciousness: awake Pain management: pain level controlled Vital Signs Assessment: post-procedure vital signs reviewed and stable Respiratory status: spontaneous breathing, nonlabored ventilation and respiratory function stable Cardiovascular status: stable and blood pressure returned to baseline Postop Assessment: no apparent nausea or vomiting Anesthetic complications: no   No notable events documented.  Last Vitals:  Vitals:   01/23/24 1213 01/23/24 1220  BP: (!) 114/51 124/67  Pulse: 68 64  Resp: 16 (!) 22  Temp:    SpO2: 96% 95%    Last Pain:  Vitals:   01/23/24 1202  TempSrc: Temporal  PainSc: Asleep                 Linton Rump

## 2024-01-23 NOTE — Op Note (Signed)
 INDICATIONS: Mitral insufficiency  PROCEDURE:   Informed consent was obtained prior to the procedure. The risks, benefits and alternatives for the procedure were discussed and the patient comprehended these risks.  Risks include, but are not limited to, cough, sore throat, vomiting, nausea, somnolence, esophageal and stomach trauma or perforation, bleeding, low blood pressure, aspiration, pneumonia, infection, trauma to the teeth and death.    After a procedural time-out, the oropharynx was anesthetized with 20% benzocaine spray.   During this procedure the patient was administered IV propofol by Anesthesiology, Dr. Freida Busman.  The transesophageal probe was inserted in the esophagus and stomach without difficulty and multiple views were obtained.  The patient was kept under observation until the patient left the procedure room.  The patient left the procedure room in stable condition.   Agitated microbubble saline contrast was not administered.  COMPLICATIONS:    There were no immediate complications.  FINDINGS:  Moderate mitral insufficiency, originating at A2-P2, central jet. Otherwise normal TEE.  RECOMMENDATIONS:     Monitor with transthoracic echo  Time Spent Directly with the Patient:  30 minutes   Caitlyn Manning 01/23/2024, 11:57 AM

## 2024-01-23 NOTE — Transfer of Care (Signed)
 Immediate Anesthesia Transfer of Care Note  Patient: Caitlyn Manning  Procedure(s) Performed: TRANSESOPHAGEAL ECHOCARDIOGRAM  Patient Location: PACU  Anesthesia Type:MAC  Level of Consciousness: sedated  Airway & Oxygen Therapy: Patient Spontanous Breathing  Post-op Assessment: Report given to RN  Post vital signs: Reviewed and stable  Last Vitals:  Vitals Value Taken Time  BP 116/61   Temp    Pulse 66 01/23/24 1202  Resp 14 01/23/24 1202  SpO2 97 % 01/23/24 1202  Vitals shown include unfiled device data.  Last Pain:  Vitals:   01/23/24 1112  TempSrc: Temporal         Complications: No notable events documented.

## 2024-02-12 ENCOUNTER — Ambulatory Visit: Admitting: Internal Medicine

## 2024-02-12 ENCOUNTER — Encounter: Payer: Self-pay | Admitting: Internal Medicine

## 2024-02-12 VITALS — BP 120/80 | HR 84 | Temp 98.4°F | Ht 64.75 in | Wt 235.2 lb

## 2024-02-12 DIAGNOSIS — Z8616 Personal history of COVID-19: Secondary | ICD-10-CM

## 2024-02-12 DIAGNOSIS — G4733 Obstructive sleep apnea (adult) (pediatric): Secondary | ICD-10-CM

## 2024-02-12 DIAGNOSIS — E669 Obesity, unspecified: Secondary | ICD-10-CM

## 2024-02-12 DIAGNOSIS — Z6839 Body mass index (BMI) 39.0-39.9, adult: Secondary | ICD-10-CM | POA: Diagnosis not present

## 2024-02-12 DIAGNOSIS — J4540 Moderate persistent asthma, uncomplicated: Secondary | ICD-10-CM

## 2024-02-12 DIAGNOSIS — R5381 Other malaise: Secondary | ICD-10-CM | POA: Diagnosis not present

## 2024-02-12 NOTE — Patient Instructions (Addendum)
 Continue Breo as prescribed Please rinse mouth after use Continue to use albuterol as needed and prior to exertion  Continue allergy medications with nasal spray and loratadine  Recommend home sleep study to assess for sleep apnea Recommend chest x-ray Recommend pulmonary function testing  Avoid Allergens and Irritants Avoid secondhand smoke Avoid SICK contacts Recommend  Masking  when appropriate Recommend Keep up-to-date with vaccinations

## 2024-02-12 NOTE — Progress Notes (Signed)
 [facility] Consultation      Date: 02/12/2024,   MRN# [id number] Caitlyn Manning [birth year]    CHIEF COMPLAINT:   Chronic cough and SOB    HISTORY OF PRESENT ILLNESS   73 year old pleasant white female seen today for chronic cough Patient diagnosed with COVID in October 2021 received Paxlovid and prednisone Ever since then patient has increased shortness of breath wheezing and coughing Patient was started on inhaled corticosteroid and long-acting beta agonist with Breo The dosage was increased to 200 which helps her symptoms Patient also was prescribed albuterol inhaler which also helps She does not use albuterol often however does use it prior to singing and exertion  Patient with history of allergic rhinitis Seems to be under control with loratadine 10 mg and Flonase  No exacerbation at this time No evidence of heart failure at this time No evidence or signs of infection at this time No respiratory distress No fevers, chills, nausea, vomiting, diarrhea No evidence of lower extremity edema No evidence hemoptysis      Patient is seen today for problems and issues with sleep related to excessive daytime sleepiness Patient  has been having sleep problems for many years Patient has been having excessive daytime sleepiness for a long time Patient has been having extreme fatigue and tiredness, lack of energy  Discussed sleep data and reviewed with patient.  Encouraged proper weight management.  Discussed driving precautions and its relationship with hypersomnolence.  Discussed operating dangerous equipment and its relationship with hypersomnolence.  Discussed sleep hygiene, and benefits of a fixed sleep waked time.  The importance of getting eight or more hours of sleep discussed with patient.  Discussed limiting the use of the computer and television before bedtime.  Decrease naps during the day, so night time sleep will become enhanced.  Limit  caffeine, and sleep deprivation.  HTN, stroke, and heart failure are potential risk factors.   Discussed risk of untreated sleep apnea including cardiac arrhthymias, stroke, DM, pulm HTN.    EPWORTH SLEEP SCORE 8   Home sleep study 2021 HST confirms the presence of mild-moderate apnea, with strong REM sleep dependence. The REM sleep related AHI is 40 versus a NREM sleep AHI of 5.9/h. there were many brief oxygen desaturations noted, but the overall time in low oxygen was not clinically relevant. Nadir 84%   PAST MEDICAL HISTORY   Past Medical History:  Diagnosis Date   Allergy    seasonal   Arthritis    Cataract    GERD (gastroesophageal reflux disease)      SURGICAL HISTORY   Past Surgical History:  Procedure Laterality Date   KNEE SURGERY  2012   bilateral knee replacement   TRANSESOPHAGEAL ECHOCARDIOGRAM (CATH LAB) N/A 01/23/2024   Procedure: TRANSESOPHAGEAL ECHOCARDIOGRAM;  Surgeon: Luana Rumple, MD;  Location: [facility] INVASIVE CV LAB;  Service: Cardiovascular;  Laterality: N/A;     FAMILY HISTORY   Family History  Problem Relation Age of Onset   Liver cancer Father      SOCIAL HISTORY   Social History   Tobacco Use   Smoking status: Never   Smokeless tobacco: Never  Substance Use Topics   Alcohol  use: No   Drug use: No     MEDICATIONS    Home Medication:  Current Outpatient Rx   Order #: [id number] Class: Historical Med   Order #: [id number] Class: Historical Med   Order #: [id number] Class: Historical Med   Order #: [id number] Class: Historical Med   Order #:  [id number] Class: Historical Med   Order #: [id number] Class: Historical Med   Order #: [id number] Class: Historical Med   Order #: [id number] Class: Normal   Order #: [id number] Class: Historical Med   Order #: [id number] Class: Historical Med   Order #: [id number] Class: Historical Med   Order #: [id number] Class: Historical Med   Order #: [id number] Class: Historical Med   Order #: [id number] Class:  Historical Med    Current Medication:  Current Outpatient Medications:    acetaminophen (TYLENOL) 650 MG CR tablet, Take 1,300 mg by mouth every 8 (eight) hours as needed for pain., Disp: , Rfl:    albuterol (VENTOLIN HFA) 108 (90 Base) MCG/ACT inhaler, Inhale 2 puffs into the lungs every 6 (six) hours as needed for shortness of breath., Disp: , Rfl:    BREO ELLIPTA 100-25 MCG/ACT AEPB, Inhale 1 puff into the lungs daily., Disp: , Rfl:    Calcium Carb-Cholecalciferol (CALCIUM 600+D) 600-800 MG-UNIT TABS, Take 2 tablets by mouth daily., Disp: , Rfl:    ELDERBERRY PO, Take 400 mg by mouth daily., Disp: , Rfl:    Lancets (ONETOUCH DELICA PLUS LANCET30G) MISC, USE TO TEST BLOOD SUGARS ONCE DAILY (DX: ICD10: R73.01) 90 DAYS, Disp: , Rfl:    loratadine (CLARITIN) 10 MG tablet, Take 10 mg by mouth daily., Disp: , Rfl:    meloxicam  (MOBIC ) 15 MG tablet, Take 1 tablet (15 mg total) by mouth daily. (Patient taking differently: Take 15 mg by mouth daily as needed for pain.), Disp: 30 tablet, Rfl: 3   Multiple Vitamins-Minerals (CENTRUM SILVER ULTRA WOMENS PO), Take 1 tablet by mouth daily with lunch., Disp: , Rfl:    MYRBETRIQ 50 MG TB24 tablet, Take 50 mg by mouth every 3 (three) days., Disp: , Rfl: 11   omeprazole (PRILOSEC) 20 MG capsule, Take 20 mg by mouth daily., Disp: , Rfl:    TART CHERRY PO, Take 2,400 mg by mouth at bedtime., Disp: , Rfl:    TURMERIC PO, Take 500 mg by mouth daily with lunch., Disp: , Rfl:    Zinc 50 MG TABS, Take 50 mg by mouth daily., Disp: , Rfl:     ALLERGIES   Tape and Latex  BP 120/80 (BP Location: Right Arm, Patient Position: Sitting, Cuff Size: Large)   Pulse 84   Temp 98.4 F (36.9 C) (Oral)   Ht 5' 4.75" (1.645 m)   Wt 235 lb 3.2 oz (106.7 kg)   SpO2 96%   BMI 39.44 kg/m    Review of Systems: Gen:  Denies  fever, sweats, chills weight loss  HEENT: Denies blurred vision, double vision, ear pain, eye pain, hearing loss, nose bleeds, sore throat Cardiac:   No dizziness, chest pain or heaviness, chest tightness,edema, No JVD Resp:   No cough, -sputum production, +shortness of breath,-wheezing, -hemoptysis,  Other:  All other systems negative   Physical Examination:   General Appearance: No distress  EYES PERRLA, EOM intact.   NECK Supple, No JVD Pulmonary: normal breath sounds, No wheezing.  CardiovascularNormal S1,S2.  No m/r/g.   Abdomen: Benign, Soft, non-tender. Neurology UE/LE 5/5 strength, no focal deficits Ext pulses intact, cap refill intact ALL OTHER ROS ARE NEGATIVE      IMAGING    ECHO TEE Result Date: 01/23/2024    TRANSESOPHOGEAL ECHO REPORT   Patient Name:   Caitlyn Manning Date of Exam: 01/23/2024 Medical Rec #:  [id number]           Height:       64.8 in Accession #:  [id number]          Weight:       228.8 lb Date of Birth:  [birth year]           BSA:          2.089 m Patient Age:    72 years            BP:           146/86 mmHg Patient Gender: F                   HR:           73 bpm. Exam Location:  Inpatient Procedure: Transesophageal Echo, 3D Echo, Cardiac Doppler and Color Doppler            (Both Spectral and Color Flow Doppler were utilized during            procedure). Indications:     Mitral insufficiency  History:         Patient has prior history of Echocardiogram examinations, most                  recent 01/14/2024.  Sonographer:     Astrid Blamer Referring Phys:  [phone] MIHAI CROITORU Diagnosing Phys: Luana Rumple MD PROCEDURE: After discussion of the risks and benefits of a TEE, an informed consent was obtained from the patient. The transesophogeal probe was passed without difficulty through the esophogus of the patient. Sedation performed by different physician. The patient's vital signs; including heart rate, blood pressure, and oxygen saturation; remained stable throughout the procedure. The patient developed no complications during the procedure.  IMPRESSIONS  1. Left ventricular ejection fraction, by  estimation, is 60 to 65%. The left ventricle has normal function. The left ventricle has no regional wall motion abnormalities.  2. Right ventricular systolic function is normal. The right ventricular size is normal.  3. Left atrial size was mildly dilated. No left atrial/left atrial appendage thrombus was detected.  4. By PISA method the effective regurgitant orifice area is 0.23 cm sq, regurgitant volume 49 mL, regurgitant fraction 43%. The mitral valve is myxomatous. Moderate mitral valve regurgitation. No evidence of mitral stenosis. There is mild late systolic prolapse of both leaflets of the mitral valve.  5. The tricuspid valve is myxomatous.  6. The aortic valve is tricuspid. There is mild thickening of the aortic valve. Aortic valve regurgitation is mild. Aortic valve sclerosis is present, with no evidence of aortic valve stenosis.  7. The inferior vena cava is normal in size with greater than 50% respiratory variability, suggesting right atrial pressure of 3 mmHg.  8. 3D performed of the mitral valve. FINDINGS  Left Ventricle: Left ventricular ejection fraction, by estimation, is 60 to 65%. The left ventricle has normal function. The left ventricle has no regional wall motion abnormalities. The left ventricular internal cavity size was normal in size. There is  no left ventricular hypertrophy. Right Ventricle: The right ventricular size is normal. No increase in right ventricular wall thickness. Right ventricular systolic function is normal. Left Atrium: Left atrial size was mildly dilated. No left atrial/left atrial appendage thrombus was detected. Right Atrium: Right atrial size was normal in size. Pericardium: There is no evidence of pericardial effusion. Mitral Valve: By PISA method the effective regurgitant orifice area is 0.23 cm sq, regurgitant volume 49 mL, regurgitant fraction 43%. The mitral valve is myxomatous. There is mild late systolic prolapse of both leaflets of the mitral valve. Moderate  mitral valve regurgitation, with centrally-directed jet. No evidence of mitral valve stenosis. The mean mitral valve gradient is 1.8 mmHg. Tricuspid Valve: The tricuspid valve is myxomatous. Tricuspid valve regurgitation is trivial. No evidence of tricuspid stenosis. Aortic Valve: The aortic valve is tricuspid. There is mild thickening of the aortic valve. Aortic valve regurgitation is mild. Aortic valve sclerosis is present, with no evidence of aortic valve stenosis. Pulmonic Valve: The pulmonic valve was normal in structure. Pulmonic valve regurgitation is not visualized. No evidence of pulmonic stenosis. Aorta: The aortic root, ascending aorta and aortic arch are all structurally normal, with no evidence of dilitation or obstruction. There is minimal (Grade I) plaque. Venous: A normal flow pattern is recorded from the right upper pulmonary vein. The inferior vena cava is normal in size with greater than 50% respiratory variability, suggesting right atrial pressure of 3 mmHg. IAS/Shunts: No atrial level shunt detected by color flow Doppler.  LEFT VENTRICLE PLAX 2D LVOT diam:     2.09 cm LV SV:         64 LV SV Index:   31 LVOT Area:     3.42 cm  AORTIC VALVE LVOT Vmax:   78.23 cm/s LVOT Vmean:  50.946 cm/s LVOT VTI:    0.187 m MITRAL VALVE MV Mean grad: 1.8 mmHg    SHUNTS MR Peak grad: 154.9 mmHg  Systemic VTI:  0.19 m MR Vmax:      622.32 cm/s Systemic Diam: 2.09 cm MR Vmean:     494.8 cm/s Karyl Paget Croitoru MD Electronically signed by Luana Rumple MD Signature Date/Time: 01/23/2024/12:55:26 PM    Final    EP STUDY Result Date: 01/23/2024 See surgical note for result.  ECHOCARDIOGRAM COMPLETE Result Date: 01/14/2024    ECHOCARDIOGRAM REPORT   Patient Name:   Caitlyn Manning Date of Exam: 01/14/2024 Medical Rec #:  [id number]           Height:       64.8 in Accession #:    [id number]          Weight:       228.8 lb Date of Birth:  05/22/1951           BSA:          2.089 m Patient Age:    72 years             BP:           130/89 mmHg Patient Gender: F                   HR:           59 bpm. Exam Location:  [address] Procedure: 2D Echo, Color Doppler, Cardiac Doppler and 3D Echo (Both Spectral            and Color Flow Doppler were utilized during procedure). Indications:    DOE R06.09  History:        Patient has prior history of Echocardiogram examinations, most                 recent 04/24/2009.  Sonographer:    Joleen Navy RDCS Referring Phys: [phone] MIHAI CROITORU IMPRESSIONS  1. Left ventricular ejection fraction, by estimation, is 60 to 65%. Left ventricular ejection fraction by 3D volume is 64 %. The left ventricle has normal function. The left ventricle has no regional wall motion abnormalities. Left ventricular diastolic  parameters are consistent with Grade II diastolic dysfunction (pseudonormalization).  2. Right  ventricular systolic function is normal. The right ventricular size is normal.  3. The mitral valve is normal in structure. Moderate mitral valve regurgitation. No evidence of mitral stenosis.  4. The aortic valve is normal in structure. Aortic valve regurgitation is not visualized. No aortic stenosis is present.  5. The inferior vena cava is normal in size with greater than 50% respiratory variability, suggesting right atrial pressure of 3 mmHg. FINDINGS  Left Ventricle: Left ventricular ejection fraction, by estimation, is 60 to 65%. Left ventricular ejection fraction by 3D volume is 64 %. The left ventricle has normal function. The left ventricle has no regional wall motion abnormalities. The left ventricular internal cavity size was normal in size. There is no left ventricular hypertrophy. Left ventricular diastolic parameters are consistent with Grade II diastolic dysfunction (pseudonormalization). Right Ventricle: The right ventricular size is normal. No increase in right ventricular wall thickness. Right ventricular systolic function is normal. Left Atrium: Left atrial size was normal  in size. Right Atrium: Right atrial size was normal in size. Pericardium: There is no evidence of pericardial effusion. Mitral Valve: The mitral valve is normal in structure. Moderate mitral valve regurgitation. No evidence of mitral valve stenosis. Tricuspid Valve: The tricuspid valve is normal in structure. Tricuspid valve regurgitation is not demonstrated. No evidence of tricuspid stenosis. Aortic Valve: The aortic valve is normal in structure. Aortic valve regurgitation is not visualized. No aortic stenosis is present. Pulmonic Valve: The pulmonic valve was normal in structure. Pulmonic valve regurgitation is trivial. No evidence of pulmonic stenosis. Aorta: The aortic root is normal in size and structure. Venous: The inferior vena cava is normal in size with greater than 50% respiratory variability, suggesting right atrial pressure of 3 mmHg. IAS/Shunts: No atrial level shunt detected by color flow Doppler. Additional Comments: 3D was performed not requiring image post processing on an independent workstation and was normal.  LEFT VENTRICLE PLAX 2D LVIDd:         4.85 cm         Diastology LVIDs:         3.00 cm         LV e' medial:    6.86 cm/s LV PW:         1.20 cm         LV E/e' medial:  13.4 LV IVS:        1.20 cm         LV e' lateral:   8.73 cm/s LVOT diam:     2.20 cm         LV E/e' lateral: 10.5 LV SV:         99 LV SV Index:   47 LVOT Area:     3.80 cm        3D Volume EF                                LV 3D EF:    Left                                             ventricul  ar                                             ejection                                             fraction                                             by 3D                                             volume is                                             64 %.                                 3D Volume EF:                                3D EF:        64 %                                 LV EDV:       134 ml                                LV ESV:       48 ml                                LV SV:        86 ml RIGHT VENTRICLE RV Basal diam:  3.20 cm RV Mid diam:    2.70 cm RV S prime:     11.60 cm/s TAPSE (M-mode): 2.0 cm LEFT ATRIUM             Index        RIGHT ATRIUM           Index LA diam:        4.00 cm 1.91 cm/m   RA Area:     15.10 cm LA Vol (A2C):   59.3 ml 28.39 ml/m  RA Volume:   35.50 ml  16.99 ml/m LA Vol (A4C):   63.6 ml 30.45 ml/m LA Biplane Vol: 62.0 ml 29.68 ml/m  AORTIC VALVE LVOT Vmax:   113.00 cm/s LVOT Vmean:  76.000 cm/s LVOT VTI:    0.261 m  AORTA Ao Root diam: 3.10 cm Ao Asc diam:  3.80 cm MITRAL VALVE MV Area (PHT): 3.42 cm    SHUNTS MV Decel  Time: 222 msec    Systemic VTI:  0.26 m MV E velocity: 91.70 cm/s  Systemic Diam: 2.20 cm MV A velocity: 86.50 cm/s MV E/A ratio:  1.06 Dorothye Gathers MD Electronically signed by Dorothye Gathers MD Signature Date/Time: 01/14/2024/4:08:32 PM    Final     CBC    Component Value Date/Time   WBC 7.0 01/20/2024 1553   RBC 4.69 01/20/2024 1553   HGB 12.7 01/20/2024 1553   HCT 39.6 01/20/2024 1553   PLT 368 01/20/2024 1553   MCV 84 01/20/2024 1553   MCH 27.1 01/20/2024 1553   MCHC 32.1 01/20/2024 1553   RDW 13.7 01/20/2024 1553      Latest Ref Rng & Units 01/20/2024    3:53 PM  BMP  Glucose 70 - 99 mg/dL 161   BUN 8 - 27 mg/dL 12   Creatinine 0.96 - 1.00 mg/dL 0.45   BUN/Creat Ratio 12 - 28 14   Sodium 134 - 144 mmol/L 143   Potassium 3.5 - 5.2 mmol/L 4.2   Chloride 96 - 106 mmol/L 105   CO2 20 - 29 mmol/L 23   Calcium 8.7 - 10.3 mg/dL 9.4      ASSESSMENT/PLAN   73 year old pleasant white female seen today for chronic cough and shortness of breath due to history of COVID infection and had acute bronchitis which led to moderate persistent asthma like symptoms in the setting of allergic rhinitis morbid obesity and deconditioned state  History of COVID infection Signs of moderate persistent asthma reactive airways  disease Symptoms seem to be controlled with Breo Rinse mouth after use Use albuterol as needed Avoid Allergens and Irritants Avoid secondhand smoke Avoid SICK contacts Recommend  Masking  when appropriate Recommend Keep up-to-date with vaccinations  Allergic rhinitis Continue loratadine and Flonase as prescribed   History of moderate-severe OSA with AHI of 30 during REM sleep HST reviewed in detail with patient from 2021 Recommend home sleep study for reassessment   Obesity -recommend significant weight loss -recommend changing diet  Deconditioned state -Recommend increased daily activity and exercise   MEDICATION ADJUSTMENTS/LABS AND TESTS ORDERED: Continue Breo as prescribed Please rinse mouth after use Continue to use albuterol as needed and prior to exertion Continue allergy medications with nasal spray and loratadine Recommend home sleep study to assess for sleep apnea Recommend chest x-ray Recommend pulmonary function testing Avoid Allergens and Irritants Avoid secondhand smoke Avoid SICK contacts Recommend  Masking  when appropriate Recommend Keep up-to-date with vaccinations    CURRENT MEDICATIONS REVIEWED AT LENGTH WITH PATIENT TODAY   Patient  satisfied with Plan of action and management. All questions answered  Follow up 6 weeks  I spent a total of 65 minutes reviewing chart data, face-to-face evaluation with the patient, counseling and coordination of care as detailed above.     Lady Pier, M.D.  [facility]  Medical Director [facility] Medical Director [facility] Cardio-Pulmonary Department

## 2024-02-19 ENCOUNTER — Ambulatory Visit: Admitting: Internal Medicine

## 2024-02-19 ENCOUNTER — Ambulatory Visit
Admission: RE | Admit: 2024-02-19 | Discharge: 2024-02-19 | Disposition: A | Discharge status: Home / Self Care | Source: Ambulatory Visit | Attending: Internal Medicine | Admitting: Internal Medicine

## 2024-02-19 DIAGNOSIS — J4540 Moderate persistent asthma, uncomplicated: Secondary | ICD-10-CM | POA: Diagnosis not present

## 2024-02-19 DIAGNOSIS — Z8616 Personal history of COVID-19: Secondary | ICD-10-CM

## 2024-02-19 DIAGNOSIS — D71 Functional disorders of polymorphonuclear neutrophils: Secondary | ICD-10-CM | POA: Diagnosis not present

## 2024-02-19 DIAGNOSIS — R0602 Shortness of breath: Secondary | ICD-10-CM | POA: Diagnosis not present

## 2024-02-19 LAB — PULMONARY FUNCTION TEST
DL/VA % pred: 95 %
DL/VA: 3.95 ml/min/mmHg/L
DLCO unc % pred: 91 %
DLCO unc: 17.48 ml/min/mmHg
FEF 25-75 Post: 2.78 L/s
FEF 25-75 Pre: 2.23 L/s
FEF2575-%Change-Post: 25 %
FEF2575-%Pred-Post: 154 %
FEF2575-%Pred-Pre: 123 %
FEV1-%Change-Post: 7 %
FEV1-%Pred-Post: 97 %
FEV1-%Pred-Pre: 91 %
FEV1-Post: 2.15 L
FEV1-Pre: 2.01 L
FEV1FVC-%Change-Post: 0 %
FEV1FVC-%Pred-Pre: 110 %
FEV6-%Change-Post: 8 %
FEV6-%Pred-Post: 93 %
FEV6-%Pred-Pre: 86 %
FEV6-Post: 2.6 L
FEV6-Pre: 2.41 L
FEV6FVC-%Pred-Post: 104 %
FEV6FVC-%Pred-Pre: 104 %
FVC-%Change-Post: 8 %
FVC-%Pred-Post: 89 %
FVC-%Pred-Pre: 82 %
FVC-Post: 2.6 L
FVC-Pre: 2.41 L
Post FEV1/FVC ratio: 83 %
Post FEV6/FVC ratio: 100 %
Pre FEV1/FVC ratio: 84 %
Pre FEV6/FVC Ratio: 100 %
RV % pred: 199 %
RV: 4.45 L
TLC % pred: 138 %
TLC: 6.98 L

## 2024-02-19 NOTE — Patient Instructions (Signed)
 Full PFT completed today ? ?

## 2024-02-19 NOTE — Progress Notes (Signed)
 Full PFT completed today ? ?

## 2024-02-24 ENCOUNTER — Encounter

## 2024-02-24 DIAGNOSIS — G4733 Obstructive sleep apnea (adult) (pediatric): Secondary | ICD-10-CM

## 2024-02-24 DIAGNOSIS — G4730 Sleep apnea, unspecified: Secondary | ICD-10-CM | POA: Diagnosis not present

## 2024-02-27 ENCOUNTER — Ambulatory Visit: Admitting: Podiatry

## 2024-02-27 ENCOUNTER — Encounter: Payer: Self-pay | Admitting: Podiatry

## 2024-02-27 VITALS — Ht 64.0 in | Wt 234.8 lb

## 2024-02-27 DIAGNOSIS — L600 Ingrowing nail: Secondary | ICD-10-CM

## 2024-02-27 NOTE — Progress Notes (Signed)
  Subjective:  Patient ID: Caitlyn Manning, female    DOB: 1951/02/11,  MRN: [id number]  73 y.o. female presents ingrown toenail to the bilateral great toes. Patient states she has gotten pedicures in the past, but wanted to get professional care. Denies any redness, swelling or drainage, but corners are uncomfortable.  Chief Complaint  Patient presents with   Nail Problem    Pt is here for  [facility] PCP is Dr Genelle Kennedy and LOV was in February.    New problem(s): None   PCP is Aldo Hun, MD.  Allergies  Allergen Reactions   Tape    Latex Itching and Rash    Review of Systems: Negative except as noted in the HPI.   Objective:  Caitlyn Manning is a pleasant 73 y.o. female in NAD. AAO x 3.  Vascular Examination: Vascular status intact b/l with palpable pedal pulses. CFT immediate b/l. Pedal hair present. No edema. No pain with calf compression b/l. Skin temperature gradient WNL b/l. No varicosities noted. No cyanosis or clubbing noted.  Neurological Examination: Sensation grossly intact b/l with 10 gram monofilament. Vibratory sensation intact b/l.  Dermatological Examination: Pedal skin with normal turgor, texture and tone b/l. No open wounds nor interdigital macerations noted. Toenails great toes incurvated medial borderl  with pain on dorsal palpation. No hyperkeratotic lesions noted b/l.   Musculoskeletal Examination: Muscle strength 5/5 to b/l LE.  No pain, crepitus noted b/l. No gross pedal deformities. Patient ambulates independently without assistive aids.   Radiographs: None  Last A1c:       No data to display           Assessment:   1. Ingrown toenail without infection    Plan:  -Patient was evaluated today. All questions/concerns addressed on today's visit. -Patient to continue soft, supportive shoe gear daily. -No invasive procedure(s) performed. Offending nail border debrided and curretaged medial border left hallux and medial border right hallux  utilizing sterile nail nipper and currette. Border cleansed with alcohol  and triple antibiotic ointment applied. No further treatment required by patient/caregiver. Shcedule appointment with Caitlyn Manning if she has any redness, drainage or swelling from digits. Call office if there are any concerns.  Return in about 3 months (around 05/29/2024).  Caitlyn Manning, DPM      [place] LOCATION: [address], [place] [address]                   Office [phone]   [facility] LOCATION: [address] [place], [place] [address] Office [phone]

## 2024-03-09 DIAGNOSIS — G4733 Obstructive sleep apnea (adult) (pediatric): Secondary | ICD-10-CM | POA: Diagnosis not present

## 2024-03-15 ENCOUNTER — Ambulatory Visit: Admitting: Family Medicine

## 2024-04-12 ENCOUNTER — Ambulatory Visit: Admitting: Internal Medicine

## 2024-04-12 ENCOUNTER — Encounter: Payer: Self-pay | Admitting: Internal Medicine

## 2024-04-12 VITALS — BP 112/60 | HR 62 | Temp 98.3°F | Ht 64.75 in | Wt 237.0 lb

## 2024-04-12 DIAGNOSIS — J4520 Mild intermittent asthma, uncomplicated: Secondary | ICD-10-CM

## 2024-04-12 DIAGNOSIS — G4733 Obstructive sleep apnea (adult) (pediatric): Secondary | ICD-10-CM

## 2024-04-12 DIAGNOSIS — Z8616 Personal history of COVID-19: Secondary | ICD-10-CM

## 2024-04-12 NOTE — Progress Notes (Signed)
 [facility] [place] [place] [facility] Consultation      Date: 04/12/2024,   MRN# [id number] Caitlyn Manning 1950-12-19    CHIEF COMPLAINT:   Follow-up assessment for OSA Follow-up assessment for  asthma in the setting of obesity    HISTORY OF PRESENT ILLNESS   73 year old pleasant white female seen today for chronic cough Patient diagnosed with COVID in October 2021 received Paxlovid and prednisone Ever since then patient has increased shortness of breath wheezing and coughing Patient was started on inhaled corticosteroid and long-acting beta agonist with Breo Breo 200 helps her symptoms Patient also was prescribed albuterol inhaler which also helps She does not use albuterol often however does use it prior to singing and exertion  Patient with history of allergic rhinitis Seems to be under control with loratadine 10 mg and Flonase  No exacerbation at this time No evidence of heart failure at this time No evidence or signs of infection at this time No respiratory distress No fevers, chills, nausea, vomiting, diarrhea No evidence of lower extremity edema No evidence hemoptysis  Pulmonary function testing May 2025 reviewed in detail with patient today FEV1 FVC ratio post bronchodilator 83% predicted No significant bronchodilator response FEV1 97% predicted FVC 89% predicted RV 199% predicted RV/TLC ratio 144% predicted DLCO 91% predicted Flow volume loops no significant abnormality Consider underlying reactive airways disease with air trapping and hyperinflation Possible small obstructive airways disease and asthma   Assessment of OSA Home sleep study 2021 HST confirms the presence of mild-moderate apnea, with strong REM sleep dependence. The REM sleep related AHI is 40 versus a NREM sleep AHI of 5.9/h. there were many brief oxygen desaturations noted, but the overall time in low oxygen was not clinically relevant. Nadir 84%   Repeat home sleep May 2025 AHI  between 13 and 19 Plan to start auto CPAP 4-12 With a AirFit N30 I mask  PAST MEDICAL HISTORY   Past Medical History:  Diagnosis Date   Allergy    seasonal   Arthritis    Cataract    GERD (gastroesophageal reflux disease)      SURGICAL HISTORY   Past Surgical History:  Procedure Laterality Date   KNEE SURGERY  2012   bilateral knee replacement   TRANSESOPHAGEAL ECHOCARDIOGRAM (CATH LAB) N/A 01/23/2024   Procedure: TRANSESOPHAGEAL ECHOCARDIOGRAM;  Surgeon: Francyne Headland, MD;  Location: [facility];  Service: Cardiovascular;  Laterality: N/A;     FAMILY HISTORY   Family History  Problem Relation Age of Onset   Liver cancer Father      SOCIAL HISTORY   Social History   Tobacco Use   Smoking status: Never   Smokeless tobacco: Never  Substance Use Topics   Alcohol  use: No   Drug use: No     MEDICATIONS    Home Medication:  Current Outpatient Rx   Order #: [id number] Class: Historical Med   Order #: [id number] Class: Historical Med   Order #: [id number] Class: Historical Med   Order #: [id number] Class: Historical Med   Order #: [id number] Class: Historical Med   Order #: [id number] Class: Normal   Order #: [id number] Class: Historical Med   Order #: [id number] Class: Historical Med   Order #: [id number] Class: Historical Med   Order #: [id number] Class: Historical Med   Order #: [id number] Class: Historical Med   Order #: [id number] Class: Historical Med   Order #: [id number] Class: Historical Med   Order #: [id number] Class: Historical Med    Current Medication:  Current Outpatient Medications:    albuterol (VENTOLIN HFA)  108 (90 Base) MCG/ACT inhaler, Inhale 2 puffs into the lungs every 6 (six) hours as needed for shortness of breath., Disp: , Rfl:    BREO ELLIPTA 200-25 MCG/ACT AEPB, , Disp: , Rfl:    Calcium Carb-Cholecalciferol (CALCIUM 600+D) 600-800 MG-UNIT TABS, Take 2 tablets by mouth daily., Disp: , Rfl:    ELDERBERRY PO, Take 400 mg by mouth daily., Disp: ,  Rfl:    Lancets (ONETOUCH DELICA PLUS LANCET30G) MISC, USE TO TEST BLOOD SUGARS ONCE DAILY (DX: ICD10: R73.01) 90 DAYS, Disp: , Rfl:    meloxicam  (MOBIC ) 15 MG tablet, Take 1 tablet (15 mg total) by mouth daily. (Patient taking differently: Take 15 mg by mouth daily as needed for pain.), Disp: 30 tablet, Rfl: 3   Multiple Vitamins-Minerals (CENTRUM SILVER ULTRA WOMENS PO), Take 1 tablet by mouth daily with lunch., Disp: , Rfl:    MYRBETRIQ 50 MG TB24 tablet, Take 50 mg by mouth every 3 (three) days., Disp: , Rfl: 11   omeprazole (PRILOSEC) 20 MG capsule, Take 20 mg by mouth daily., Disp: , Rfl:    ONETOUCH VERIO test strip, 1 each by Other route as needed for other., Disp: , Rfl:    potassium gluconate 595 (99 K) MG TABS tablet, Take 650 mg by mouth daily., Disp: , Rfl:    TART CHERRY PO, Take 2,400 mg by mouth at bedtime., Disp: , Rfl:    TURMERIC PO, Take 500 mg by mouth daily with lunch., Disp: , Rfl:    Zinc 50 MG TABS, Take 50 mg by mouth daily., Disp: , Rfl:     ALLERGIES   Tape and Latex  BP 112/60 (BP Location: Right Arm, Patient Position: Sitting, Cuff Size: Large)   Pulse 62   Temp 98.3 F (36.8 C) (Oral)   Ht 5' 4.75 (1.645 m)   Wt 237 lb (107.5 kg)   SpO2 99%   BMI 39.74 kg/m      Review of Systems: Gen:  Denies  fever, sweats, chills weight loss  HEENT: Denies blurred vision, double vision, ear pain, eye pain, hearing loss, nose bleeds, sore throat Cardiac:  No dizziness, chest pain or heaviness, chest tightness,edema, No JVD Resp:   No cough, -sputum production, -shortness of breath,-wheezing, -hemoptysis,  Other:  All other systems negative   Physical Examination:   General Appearance: No distress  EYES PERRLA, EOM intact.   NECK Supple, No JVD Pulmonary: normal breath sounds, No wheezing.  CardiovascularNormal S1,S2.  No m/r/g.   Abdomen: Benign, Soft, non-tender. Neurology UE/LE 5/5 strength, no focal deficits Ext pulses intact, cap refill intact ALL  OTHER ROS ARE NEGATIVE       IMAGING    No results found.   CBC    Component Value Date/Time   WBC 7.0 01/20/2024 1553   RBC 4.69 01/20/2024 1553   HGB 12.7 01/20/2024 1553   HCT 39.6 01/20/2024 1553   PLT 368 01/20/2024 1553   MCV 84 01/20/2024 1553   MCH 27.1 01/20/2024 1553   MCHC 32.1 01/20/2024 1553   RDW 13.7 01/20/2024 1553      Latest Ref Rng & Units 01/20/2024    3:53 PM  BMP  Glucose 70 - 99 mg/dL 894   BUN 8 - 27 mg/dL 12   Creatinine 9.42 - 1.00 mg/dL 9.16   BUN/Creat Ratio 12 - 28 14   Sodium 134 - 144 mmol/L 143   Potassium 3.5 - 5.2 mmol/L 4.2   Chloride 96 - 106  mmol/L 105   CO2 20 - 29 mmol/L 23   Calcium 8.7 - 10.3 mg/dL 9.4      ASSESSMENT/PLAN  73 year old pleasant white female seen today for follow-up assessment for chronic cough shortness of breath with a history of COVID-19 infection with signs symptoms of mild intermittent asthma with air trapping and hyperinflation on pulmonary function testing in the setting of moderate sleep apnea with morbid obesity and deconditioned state   History of COVID infection Signs of  mild persistent asthma reactive airways disease Symptoms seem to be controlled with Breo Rinse mouth after use Use albuterol as needed Avoid Allergens and Irritants Avoid secondhand smoke Avoid SICK contacts Recommend  Masking  when appropriate Recommend Keep up-to-date with vaccinations   Allergic rhinitis Continue loratadine and Flonase as prescribed   History of moderate-severe OSA with AHI of 30 during REM sleep HST reviewed in detail with patient from 2021 HST May 2025 AHI 13-19 Start auto CPAP 4-12 AirFit N30i mask  Obesity -recommend significant weight loss -recommend changing diet  Deconditioned state -Recommend increased daily activity and exercise   MEDICATION ADJUSTMENTS/LABS AND TESTS ORDERED: Continue Breo as prescribed Rinse mouth after use Start auto CPAP 4-12 for moderate sleep apnea AirFit  N30i mask Avoid Allergens and Irritants Avoid secondhand smoke Avoid SICK contacts Recommend  Masking  when appropriate Recommend Keep up-to-date with vaccinations Recommend weight loss   CURRENT MEDICATIONS REVIEWED AT LENGTH WITH PATIENT TODAY   Patient  satisfied with Plan of action and management. All questions answered   Follow up 3 months   I spent a total of 48 minutes dedicated to the care of this patient on the date of this encounter to include pre-visit review of records, face-to-face time with the patient discussing conditions above, post visit ordering of testing, clinical documentation with the electronic health record, making appropriate referrals as documented, and communicating necessary information to the patient's healthcare team.    The Patient requires high complexity decision making for assessment and support, frequent evaluation and titration of therapies, application of advanced monitoring technologies and extensive interpretation of multiple databases.  Patient satisfied with Plan of action and management. All questions answered    Nickolas Alm Cellar, M.D.  [facility]  Medical Director [facility] Medical Director [facility] Cardio-Pulmonary Department

## 2024-04-12 NOTE — Patient Instructions (Signed)
 Continue Breo as prescribed Rinse mouth after use  Start auto CPAP 4-12 for moderate sleep apnea AirFit N30i mask  Avoid Allergens and Irritants Avoid secondhand smoke Avoid SICK contacts Recommend  Masking  when appropriate Recommend Keep up-to-date with vaccinations  Recommend weight loss

## 2024-05-10 DIAGNOSIS — H1013 Acute atopic conjunctivitis, bilateral: Secondary | ICD-10-CM | POA: Diagnosis not present

## 2024-05-17 DIAGNOSIS — G4733 Obstructive sleep apnea (adult) (pediatric): Secondary | ICD-10-CM | POA: Diagnosis not present

## 2024-05-18 DIAGNOSIS — G4733 Obstructive sleep apnea (adult) (pediatric): Secondary | ICD-10-CM | POA: Diagnosis not present

## 2024-05-19 NOTE — Progress Notes (Unsigned)
 [facility] [address] [place] [address] Phone: [phone] Subjective:   Caitlyn Manning, am serving as a scribe for Dr. Arthea Claudene.  I'm seeing this patient by the request  of:  Shayne Anes, MD  CC: Bilateral thumb pain  YEP:Dlagzrupcz  12/19/2023 Chronic problem with exacerbation.  Discussed icing regimen and home exercises, discussed which activities to do and which ones to avoid.  Increase activity slowly.  Discussed icing regimen.  Follow-up again in 6 to 8 weeks.     Update 05/21/2024 Caitlyn Manning is a 73 y.o. female coming in with complaint of B thumb pain. Patient states that she is having an increase in pain in the L thumb which is worse than R.       Past Medical History:  Diagnosis Date   Allergy    seasonal   Arthritis    Asthma 10/13/23   I was having trouble with shortness of breathe and couching. PA decided to try treating as Asthma to see what happened.   Cataract    Clotting disorder (HCC)    I have always bleed very easily even from just a minor cut or scratch.   GERD (gastroesophageal reflux disease)    Past Surgical History:  Procedure Laterality Date   KNEE SURGERY  2012   bilateral knee replacement   TRANSESOPHAGEAL ECHOCARDIOGRAM (CATH LAB) N/A 01/23/2024   Procedure: TRANSESOPHAGEAL ECHOCARDIOGRAM;  Surgeon: Francyne Headland, MD;  Location: [facility];  Service: Cardiovascular;  Laterality: N/A;   Social History   Socioeconomic History   Marital status: Widowed    Spouse name: Not on file   Number of children: Not on file   Years of education: Not on file   Highest education level: Not on file  Occupational History   Not on file  Tobacco Use   Smoking status: Never   Smokeless tobacco: Never  Substance and Sexual Activity   Alcohol  use: No   Drug use: No   Sexual activity: Not on file  Other Topics Concern   Not on file  Social History Narrative   Not on file   Social Drivers  of Health   Financial Resource Strain: Not on file  Food Insecurity: Not on file  Transportation Needs: Not on file  Physical Activity: Not on file  Stress: Not on file  Social Connections: Not on file   Allergies  Allergen Reactions   Tape    Latex Itching and Rash   Family History  Problem Relation Age of Onset   Liver cancer Father    Heart attack Father    Heart disease Father    Clotting disorder Maternal Grandmother       Current Outpatient Medications (Respiratory):    albuterol (VENTOLIN HFA) 108 (90 Base) MCG/ACT inhaler, Inhale 2 puffs into the lungs every 6 (six) hours as needed for shortness of breath.   BREO ELLIPTA 200-25 MCG/ACT AEPB,   Current Outpatient Medications (Analgesics):    meloxicam  (MOBIC ) 15 MG tablet, Take 1 tablet (15 mg total) by mouth daily. (Patient taking differently: Take 15 mg by mouth daily as needed for pain.)   Current Outpatient Medications (Other):    Calcium Carb-Cholecalciferol (CALCIUM 600+D) 600-800 MG-UNIT TABS, Take 2 tablets by mouth daily.   ELDERBERRY PO, Take 400 mg by mouth daily.   Lancets (ONETOUCH DELICA PLUS LANCET30G) MISC, USE TO TEST BLOOD SUGARS ONCE DAILY (DX: ICD10: R73.01) 90 DAYS   Multiple Vitamins-Minerals (  CENTRUM SILVER ULTRA WOMENS PO), Take 1 tablet by mouth daily with lunch.   MYRBETRIQ 50 MG TB24 tablet, Take 50 mg by mouth every 3 (three) days.   omeprazole (PRILOSEC) 20 MG capsule, Take 20 mg by mouth daily.   ONETOUCH VERIO test strip, 1 each by Other route as needed for other.   potassium gluconate 595 (99 K) MG TABS tablet, Take 650 mg by mouth daily.   TART CHERRY PO, Take 2,400 mg by mouth at bedtime.   TURMERIC PO, Take 500 mg by mouth daily with lunch.   Zinc 50 MG TABS, Take 50 mg by mouth daily.   Reviewed prior external information including notes and imaging from  primary care provider As well as notes that were available from care everywhere and other healthcare systems.  Past  medical history, social, surgical and family history all reviewed in electronic medical record.  No pertanent information unless stated regarding to the chief complaint.   Review of Systems:  No headache, visual changes, nausea, vomiting, diarrhea, constipation, dizziness, abdominal pain, skin rash, fevers, chills, night sweats, weight loss, swollen lymph nodes, body aches, joint swelling, chest pain, shortness of breath, mood changes. POSITIVE muscle aches  Objective  There were no vitals taken for this visit.   General: No apparent distress alert and oriented x3 mood and affect normal, dressed appropriately.  HEENT: Pupils equal, extraocular movements intact  Respiratory: Patient's speak in full sentences and does not appear short of breath  Cardiovascular: No lower extremity edema, non tender, no erythema  Bilateral thumb exam shows arthritic changes noted.  Seems to be swelling more on the left thumb.  Moderate in nature.  Limited muscular skeletal ultrasound was performed and interpreted by CLAUDENE HUSSAR, M  Does have effusion noted with some chronic changes of the left thumb.  Right CMC joint though appears to be actually fairly unremarkable with swelling but does have arthritic changes noted.   Procedure: Real-time Ultrasound Guided Injection of left CMC joint Device: GE Logiq Q7 Ultrasound guided injection is preferred based studies that show increased duration, increased effect, greater accuracy, decreased procedural pain, increased response rate, and decreased cost with ultrasound guided versus blind injection.  Verbal informed consent obtained.  Time-out conducted.  Noted no overlying erythema, induration, or other signs of local infection.  Skin prepped in a sterile fashion.  Local anesthesia: Topical Ethyl chloride.  With sterile technique and under real time ultrasound guidance: With a 25-gauge half inch needle injected with 0.5 cc of 0.5% Marcaine and 0.5 cc of Kenalog  40  mg/mL Completed without difficulty  Pain immediately resolved suggesting accurate placement of the medication.  Advised to call if fevers/chills, erythema, induration, drainage, or persistent bleeding.  Impression: Technically successful ultrasound guided injection.   Impression and Recommendations:     The above documentation has been reviewed and is accurate and complete Keylani Perlstein M Dwaine Pringle, DO

## 2024-05-21 ENCOUNTER — Encounter: Payer: Self-pay | Admitting: Family Medicine

## 2024-05-21 ENCOUNTER — Other Ambulatory Visit: Payer: Self-pay

## 2024-05-21 ENCOUNTER — Ambulatory Visit: Admitting: Family Medicine

## 2024-05-21 VITALS — BP 122/72 | HR 66 | Ht 64.0 in | Wt 239.0 lb

## 2024-05-21 DIAGNOSIS — M79644 Pain in right finger(s): Secondary | ICD-10-CM

## 2024-05-21 DIAGNOSIS — M180 Bilateral primary osteoarthritis of first carpometacarpal joints: Secondary | ICD-10-CM

## 2024-05-21 DIAGNOSIS — M79645 Pain in left finger(s): Secondary | ICD-10-CM | POA: Diagnosis not present

## 2024-05-21 NOTE — Patient Instructions (Addendum)
 Injected L thumb Please let me know if you need it in the R See me again in 3-4 months

## 2024-05-21 NOTE — Assessment & Plan Note (Signed)
 Continue chronic problem, discussed icing regimen and home exercises, discussed which activities to do in which ones to avoid.  Increase activity slowly.  Potential bracing.  Patient wants to avoid surgical intervention.  Would consider the possibility of PRP.  Follow-up again in 3 to 4 months

## 2024-05-28 ENCOUNTER — Ambulatory Visit: Admitting: Podiatry

## 2024-05-28 DIAGNOSIS — M79674 Pain in right toe(s): Secondary | ICD-10-CM | POA: Diagnosis not present

## 2024-05-28 DIAGNOSIS — B351 Tinea unguium: Secondary | ICD-10-CM

## 2024-05-28 DIAGNOSIS — M79675 Pain in left toe(s): Secondary | ICD-10-CM

## 2024-05-31 DIAGNOSIS — G4733 Obstructive sleep apnea (adult) (pediatric): Secondary | ICD-10-CM | POA: Diagnosis not present

## 2024-06-01 DIAGNOSIS — H25813 Combined forms of age-related cataract, bilateral: Secondary | ICD-10-CM | POA: Diagnosis not present

## 2024-06-01 DIAGNOSIS — H5203 Hypermetropia, bilateral: Secondary | ICD-10-CM | POA: Diagnosis not present

## 2024-06-03 ENCOUNTER — Encounter: Payer: Self-pay | Admitting: Podiatry

## 2024-06-03 NOTE — Progress Notes (Signed)
  Subjective:  Manning ID: Caitlyn Manning, female    DOB: Oct 11, 1951,  MRN: [id number]  Caitlyn Manning presents to clinic today for painful thick toenails that are difficult to trim. Pain interferes with ambulation. Aggravating factors include wearing enclosed shoe gear. Pain is relieved with periodic professional debridement.  Chief Complaint  Manning presents with   Nail Problem    Thick painful toenails, 3 month follow up    New problem(s): None.   PCP is Shayne Anes, MD. Caitlyn Manning 02/27/2024.  Allergies  Allergen Reactions   Tape    Latex Itching and Rash    Review of Systems: Negative except as noted in the HPI.  Objective: No changes noted in today's physical examination. There were no vitals filed for this visit. Caitlyn Manning is a pleasant 73 y.o. female obese in NAD. AAO x 3.   Vascular Examination: Vascular status intact b/l with palpable pedal pulses. CFT immediate b/l. Pedal hair present. No edema. No pain with calf compression b/l. Skin temperature gradient WNL b/l. No varicosities noted. No cyanosis or clubbing noted.  Neurological Examination: Sensation grossly intact b/l with 10 gram monofilament. Vibratory sensation intact b/l.  Dermatological Examination: Pedal skin with normal turgor, texture and tone b/l. No open wounds nor interdigital macerations noted. Toenails 1-5 b/l elongated, discolored, dystrophic, thickened, crumbly with subungual debris and tenderness to dorsal palpation. No hyperkeratotic lesions noted b/l.   Musculoskeletal Examination: Muscle strength 5/5 to b/l LE.  No pain, crepitus noted b/l. No gross pedal deformities. Manning ambulates independently without assistive aids.   Radiographs: None  Assessment/Plan: 1. Pain due to onychomycosis of toenails of both feet   Manning was evaluated and treated. All Manning's and/or POA's questions/concerns addressed on today's visit. Toenails 1-5 debrided in length and girth without  incident. Continue soft, supportive shoe gear daily. Report any pedal injuries to medical professional. Call office if there are any questions/concerns. -Manning/POA to call should there be question/concern in the interim.   Return in about 3 months (around 08/28/2024).  Caitlyn Manning, DPM      [facility] LOCATION: [address], [place] [address]                   Office [phone]   [facility] LOCATION: [address] [place], [place] [address] Office [phone]

## 2024-06-24 ENCOUNTER — Telehealth: Payer: Self-pay

## 2024-06-24 DIAGNOSIS — G4733 Obstructive sleep apnea (adult) (pediatric): Secondary | ICD-10-CM

## 2024-06-24 NOTE — Telephone Encounter (Signed)
 Copied from CRM [id number]. Topic: Clinical - Order For Equipment >> Jun 24, 2024 10:57 AM [name] wrote: Reason for CRM: Patient [phone] would like to speak with nurse to set up another cpap prescription. Patient states turned in the cpap machine to [facility] in 06/17/24, the mask was not fitting. Patient states does not have cpap machine now and needs a new cpap order set up and want to discuss going to [facility] supplies or to the [facility] the office uses. Please consult and call back.

## 2024-06-24 NOTE — Telephone Encounter (Signed)
 Patient advised that we will send DME order to [facility]. She is aware that insurance may request new sleep study and new order. Advised patient to call back if she does not hear form [employer] in about 2 weeks. NFN.

## 2024-07-06 ENCOUNTER — Ambulatory Visit: Admitting: Internal Medicine

## 2024-07-26 DIAGNOSIS — Z803 Family history of malignant neoplasm of breast: Secondary | ICD-10-CM | POA: Diagnosis not present

## 2024-07-26 DIAGNOSIS — L988 Other specified disorders of the skin and subcutaneous tissue: Secondary | ICD-10-CM | POA: Diagnosis not present

## 2024-07-29 ENCOUNTER — Ambulatory Visit: Admitting: Internal Medicine

## 2024-07-29 ENCOUNTER — Encounter: Payer: Self-pay | Admitting: Internal Medicine

## 2024-07-29 VITALS — BP 140/80 | HR 86 | Temp 98.7°F | Ht 64.0 in | Wt 236.2 lb

## 2024-07-29 DIAGNOSIS — Z6841 Body Mass Index (BMI) 40.0 and over, adult: Secondary | ICD-10-CM | POA: Diagnosis not present

## 2024-07-29 DIAGNOSIS — G4733 Obstructive sleep apnea (adult) (pediatric): Secondary | ICD-10-CM

## 2024-07-29 DIAGNOSIS — J4520 Mild intermittent asthma, uncomplicated: Secondary | ICD-10-CM

## 2024-07-29 MED ORDER — BREO ELLIPTA 200-25 MCG/ACT IN AEPB: 1.0000 | INHALATION_SPRAY | Freq: Every day | RESPIRATORY_TRACT | 12 refills | Status: AC

## 2024-07-29 MED ORDER — ALBUTEROL SULFATE HFA 108 (90 BASE) MCG/ACT IN AERS: 2.0000 | INHALATION_SPRAY | Freq: Four times a day (QID) | RESPIRATORY_TRACT | 10 refills | Status: AC | PRN

## 2024-07-29 NOTE — Patient Instructions (Addendum)
 Excellent Job A+ GOLD STAR!!  Continue CPAP as prescribed  Patient Instructions Continue to use CPAP every night, minimum of 4-6 hours a night.  Change equipment every 30 days or as directed by DME.  Wash your tubing with warm soap and water daily, hang to dry. Wash humidifier portion weekly. Use bottled, distilled water and change daily   Be aware of reduced alertness and do not drive or operate heavy machinery if experiencing this or drowsiness.  Exercise encouraged, as tolerated. Encouraged proper weight management.  Important to get eight or more hours of sleep  Limiting the use of the computer and television before bedtime.  Decrease naps during the day, so night time sleep will become enhanced.  Limit caffeine, and sleep deprivation.    Avoid Allergens and Irritants Avoid secondhand smoke Avoid SICK contacts Recommend  Masking  when appropriate Recommend Keep up-to-date with vaccinations  Continue Breo as prescribed Albuterol as needed Rinse mouth after use Recommend weight loss

## 2024-07-29 NOTE — Progress Notes (Signed)
 [facility] Pulmonary Medicine Consultation      Date: 07/29/2024,   MRN# [id number] ERNESTINA JOE [birth year]  SYNOPSIS 73 year old pleasant with  chronic cough Patient diagnosed with COVID in October 2021 received Paxlovid and prednisone Ever since then patient has increased shortness of breath wheezing and coughing Patient was started on inhaled corticosteroid and long-acting beta agonist with Breo Breo 200 helps her symptoms Patient also was prescribed albuterol inhaler which also helps She does not use albuterol often however does use it prior to singing and exertion Patient with history of allergic rhinitis Seems to be under control with loratadine 10 mg and Flonase   May 2025 Pulmonary function testing FEV1 FVC ratio post bronchodilator 83% predicted No significant bronchodilator response FEV1 97% predicted FVC 89% predicted RV 199% predicted RV/TLC ratio 144% predicted DLCO 91% predicted Flow volume loops no significant abnormality Consider underlying reactive airways disease with air trapping and hyperinflation Possible small obstructive airways disease and asthma  Home sleep study 2021 HST confirms the presence of mild-moderate apnea, with strong REM sleep dependence. The REM sleep related AHI is 40 versus a NREM sleep AHI of 5.9/h. there were many brief oxygen desaturations noted, but the overall time in low oxygen was not clinically relevant. 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  Encouraged proper weight management.  Discussed driving precautions and its relationship with hypersomnolence.  Discussed sleep hygiene, and benefits of a fixed sleep waked time.  The importance of getting eight or more hours of sleep discussed with patient.  Discussed limiting the use of the computer and television before bedtime.  Decrease naps during the day, so night time sleep will become enhanced.  Limit caffeine, and sleep deprivation.   Patient uses and benefits from therapy Using CPAP nightly and with naps Pressure setting is comfortable and is sleeping well. AHI well-controlled 2.4 Auto CPAP 4-12 Patient having issues with DME company and getting mask supplies  Current weight 236 pounds at 5 feet 4 inches  PAST MEDICAL HISTORY   Past Medical History:  Diagnosis Date   Allergy    seasonal   Arthritis    Asthma 10/13/23   I was having trouble with shortness of breathe and couching. PA decided to try treating as Asthma to see what happened.   Cataract    Clotting disorder    I have always bleed very easily even from just a minor cut or scratch.   GERD (gastroesophageal reflux disease)      SURGICAL HISTORY   Past Surgical History:  Procedure Laterality Date   KNEE SURGERY  2012   bilateral knee replacement   TRANSESOPHAGEAL ECHOCARDIOGRAM (CATH LAB) N/A 01/23/2024   Procedure: TRANSESOPHAGEAL ECHOCARDIOGRAM;  Surgeon: Francyne Headland, MD;  Location: [facility] INVASIVE CV LAB;  Service: Cardiovascular;  Laterality: N/A;     FAMILY HISTORY   Family History  Problem Relation Age of Onset   Liver cancer Father    Heart attack Father    Heart disease Father    Clotting disorder Maternal Grandmother      SOCIAL  HISTORY   Social History   Tobacco Use   Smoking status: Never   Smokeless tobacco: Never  Substance Use Topics   Alcohol  use: No   Drug use: No     MEDICATIONS    Home Medication:  Current Outpatient Rx   Order #: [id number] Class: Historical Med   Order #: [id number] Class: Historical Med   Order #: [id number] Class: Historical Med   Order #: [id number] Class: Historical Med   Order #: [id number] Class: Historical Med   Order #: [id number] Class: Historical Med   Order #: [id number] Class: Normal   Order #: [id number] Class: Historical Med   Order #: [id number] Class: Historical Med   Order #: [id number] Class: Historical Med   Order #: [id number] Class: Historical Med   Order #: [id number] Class: Historical Med   Order #: [id number] Class: Historical Med   Order #: [id number] Class: Historical Med   Order #: [id number] Class: Historical Med   Order #: [id number] Class: Historical Med   Order #: [id number] Class: Historical Med    Current Medication:  Current Outpatient Medications:    meclizine (ANTIVERT) 12.5 MG tablet, Take 12.5 mg by mouth 2 (two) times daily as needed for dizziness or nausea., Disp: , Rfl:    albuterol (VENTOLIN HFA) 108 (90 Base) MCG/ACT inhaler, Inhale 2 puffs into the lungs every 6 (six) hours as needed for shortness of breath., Disp: , Rfl:    BREO ELLIPTA 200-25 MCG/ACT AEPB, , Disp: , Rfl:    Calcium Carb-Cholecalciferol (CALCIUM 600+D) 600-800 MG-UNIT TABS, Take 2 tablets by mouth daily., Disp: , Rfl:    ELDERBERRY PO, Take 400 mg by mouth daily., Disp: , Rfl:    Lancets (ONETOUCH DELICA PLUS LANCET30G) MISC, USE TO TEST BLOOD SUGARS ONCE DAILY (DX: ICD10: R73.01) 90 DAYS, Disp: , Rfl:    meloxicam  (MOBIC ) 15 MG tablet, Take 1 tablet (15 mg total) by mouth daily. (Patient taking differently: Take 15 mg by mouth daily as needed for pain.), Disp: 30 tablet, Rfl: 3   Multiple Vitamins-Minerals (CENTRUM SILVER ULTRA WOMENS PO), Take 1 tablet by mouth daily with lunch., Disp: ,  Rfl:    MYRBETRIQ 50 MG TB24 tablet, Take 50 mg by mouth every 3 (three) days., Disp: , Rfl: 11   omeprazole (PRILOSEC) 20 MG capsule, Take 20 mg by mouth daily., Disp: , Rfl:    ONETOUCH VERIO test strip, 1 each by Other route as needed for other., Disp: , Rfl:    potassium gluconate 595 (99 K) MG TABS tablet, Take 650 mg by mouth daily., Disp: , Rfl:    TART CHERRY PO, Take 2,400 mg by mouth at bedtime., Disp: , Rfl:    triamcinolone  (NASACORT  ALLERGY 24HR) 55 MCG/ACT AERO nasal inhaler, Place 2 sprays into the nose daily., Disp: , Rfl:    TURMERIC PO, Take 500 mg by mouth daily with lunch., Disp: , Rfl:    valACYclovir (VALTREX) 1000 MG tablet, Take 1,000 mg by mouth daily., Disp: , Rfl:    Zinc 50 MG TABS, Take 50 mg by mouth daily., Disp: , Rfl:     ALLERGIES   Tape and Latex  BP (!) 140/80   Pulse 86   Temp 98.7 F (37.1 C)   Ht 5' 4 (  1.626 m)   Wt 236 lb 3.2 oz (107.1 kg)   SpO2 96%   BMI 40.54 kg/m     Physical Examination:  General Appearance: No distress  EYES EOM intact.   NECK Supple, No JVD Pulmonary: normal breath sounds, No wheezing.  CardiovascularNormal S1,S2.  No m/r/g.   Ext pulses intact, cap refill intact  ALL OTHER ROS ARE NEGATIVE    CBC    Component Value Date/Time   WBC 7.0 01/20/2024 1553   RBC 4.69 01/20/2024 1553   HGB 12.7 01/20/2024 1553   HCT 39.6 01/20/2024 1553   PLT 368 01/20/2024 1553   MCV 84 01/20/2024 1553   MCH 27.1 01/20/2024 1553   MCHC 32.1 01/20/2024 1553   RDW 13.7 01/20/2024 1553      Latest Ref Rng & Units 01/20/2024    3:53 PM  BMP  Glucose 70 - 99 mg/dL 894   BUN 8 - 27 mg/dL 12   Creatinine 9.42 - 1.00 mg/dL 9.16   BUN/Creat Ratio 12 - 28 14   Sodium 134 - 144 mmol/L 143   Potassium 3.5 - 5.2 mmol/L 4.2   Chloride 96 - 106 mmol/L 105   CO2 20 - 29 mmol/L 23   Calcium 8.7 - 10.3 mg/dL 9.4      ASSESSMENT/PLAN  73 year old pleasant white female seen today for follow-up assessment for chronic cough  shortness of breath with a history of COVID-19 infection with signs symptoms of mild intermittent asthma with air trapping and hyperinflation on pulmonary function testing in the setting of moderate sleep apnea with morbid obesity and deconditioned state   History of COVID infection Signs of  mild persistent asthma reactive airways disease Symptoms are controlled with Breo Rinse mouth after use No exacerbation at this time  No indication for antibiotics or prednisone Use albuterol prior to exertion and once again Avoid Allergens and Irritants Avoid secondhand smoke Avoid SICK contacts Recommend  Masking  when appropriate Recommend Keep up-to-date with vaccinations  Allergic rhinitis Continue loratadine and Flonase as prescribed    Assessment of OSA Previous AHI  13-19 History of moderate-severe OSA with AHI of 30 during REM sleep Continue CPAP as prescribed  Excellent compliance report Reviewed compliance report in detail with patient Patient definitely benefits the use of CPAP therapy as prescribed Using CPAP nightly and with naps Pressure setting is comfortable and is sleeping well. CPAP prescription 4-12 AHI reduced to 2.4  No evidence of acute heart failure at this time No respiratory distress No fevers, chills, nausea, vomiting, diarrhea No evidence hemoptysis  Patient Instructions Continue to use CPAP every night, minimum of 4-6 hours a night.  Change equipment every 30 days or as directed by DME.  Wash your tubing with warm soap and water daily, hang to dry.  Wash humidifier portion weekly. Use bottled, distilled water and change daily  Risk of untreated sleep apnea including cardiac arrhthymias, stroke, DM, pulm HTN.  Plan to give P30i AIRFIT MASK  Obesity -recommend significant weight loss -recommend changing diet  Deconditioned state -Recommend increased daily activity and exercise   MEDICATION ADJUSTMENTS/LABS AND TESTS ORDERED: Continue Breo as  prescribed Rinse mouth after use Continue CPAP as prescribed AirFit P30i mask Recommend weight loss Avoid Allergens and Irritants Avoid secondhand smoke Avoid SICK contacts Recommend  Masking  when appropriate Recommend Keep up-to-date with vaccinations   CURRENT MEDICATIONS REVIEWED AT LENGTH WITH PATIENT TODAY   Patient  satisfied with Plan of action and management. All questions  answered   Follow up 1 year   I spent a total of 43 minutes dedicated to the care of this patient on the date of this encounter to include pre-visit review of records, face-to-face time with the patient discussing conditions above, post visit ordering of testing, clinical documentation with the electronic health record, making appropriate referrals as documented, and communicating necessary information to the patient's healthcare team.    The Patient requires high complexity decision making for assessment and support, frequent evaluation and titration of therapies, application of advanced monitoring technologies and extensive interpretation of multiple databases.  Patient satisfied with Plan of action and management. All questions answered    Nickolas Alm Cellar, M.D.  [facility] Pulmonary & Critical Care Medicine  Medical Director [facility]

## 2024-08-03 DIAGNOSIS — R928 Other abnormal and inconclusive findings on diagnostic imaging of breast: Secondary | ICD-10-CM | POA: Diagnosis not present

## 2024-08-26 NOTE — Progress Notes (Unsigned)
 [facility] [address] [place] [address] Phone: [phone] Subjective:   LILLETTE Berwyn Posey, am serving as a scribe for Dr. Arthea Claudene.  I'm seeing this patient by the request  of:  Shayne Anes, MD  CC: Bilateral thumb pain  YEP:Dlagzrupcz  05/21/2024 Continue chronic problem, discussed icing regimen and home exercises, discussed which activities to do in which ones to avoid.  Increase activity slowly.  Potential bracing.  Patient wants to avoid surgical intervention.  Would consider the possibility of PRP.  Follow-up again in 3 to 4 months     Update 08/27/2024 Caitlyn Manning is a 73 y.o. female coming in with complaint of B thumb pain. L thumb injection last visit. Patient states that injection was helpful for about 2 months.  Patient states now starting to worsen again.     Past Medical History:  Diagnosis Date   Allergy    seasonal   Arthritis    Asthma 10/13/23   I was having trouble with shortness of breathe and couching. PA decided to try treating as Asthma to see what happened.   Cataract    Clotting disorder    I have always bleed very easily even from just a minor cut or scratch.   GERD (gastroesophageal reflux disease)    Past Surgical History:  Procedure Laterality Date   KNEE SURGERY  2012   bilateral knee replacement   TRANSESOPHAGEAL ECHOCARDIOGRAM (CATH LAB) N/A 01/23/2024   Procedure: TRANSESOPHAGEAL ECHOCARDIOGRAM;  Surgeon: Francyne Headland, MD;  Location: [facility] IN[facility] CV LAB;  Service: Cardiovascular;  Laterality: N/A;   Social History   Socioeconomic History   Marital status: Widowed    Spouse name: Not on file   Number of children: Not on file   Years of education: Not on file   Highest education level: Not on file  Occupational History   Not on file  Tobacco Use   Smoking status: Never   Smokeless tobacco: Never  Substance and Sexual Activity   Alcohol  use: No   Drug use: No   Sexual activity:  Not on file  Other Topics Concern   Not on file  Social History Narrative   Not on file   Social Drivers of Health   Financial Resource Strain: Not on file  Food Insecurity: Not on file  Transportation Needs: Not on file  Physical Activity: Not on file  Stress: Not on file  Social Connections: Not on file   Allergies  Allergen Reactions   Tape    Latex Itching and Rash   Family History  Problem Relation Age of Onset   Liver cancer Father    Heart attack Father    Heart disease Father    Clotting disorder Maternal Grandmother       Current Outpatient Medications (Respiratory):    albuterol (VENTOLIN HFA) 108 (90 Base) MCG/ACT inhaler, Inhale 2 puffs into the lungs every 6 (six) hours as needed for shortness of breath.   BREO ELLIPTA 200-25 MCG/ACT AEPB, Inhale 1 puff into the lungs daily.   triamcinolone  (NASACORT  ALLERGY 24HR) 55 MCG/ACT AERO nasal inhaler, Place 2 sprays into the nose daily.  Current Outpatient Medications (Analgesics):    meloxicam  (MOBIC ) 15 MG tablet, Take 1 tablet (15 mg total) by mouth daily. (Patient taking differently: Take 15 mg by mouth daily as needed for pain.)   Current Outpatient Medications (Other):    Calcium Carb-Cholecalciferol (CALCIUM 600+D) 600-800 MG-UNIT TABS, Take 2 tablets by  mouth daily.   ELDERBERRY PO, Take 400 mg by mouth daily.   Lancets (ONETOUCH DELICA PLUS LANCET30G) MISC, USE TO TEST BLOOD SUGARS ONCE DAILY (DX: ICD10: R73.01) 90 DAYS   Multiple Vitamins-Minerals (CENTRUM SILVER ULTRA WOMENS PO), Take 1 tablet by mouth daily with lunch.   MYRBETRIQ 50 MG TB24 tablet, Take 50 mg by mouth every 3 (three) days.   omeprazole (PRILOSEC) 20 MG capsule, Take 20 mg by mouth daily.   ONETOUCH VERIO test strip, 1 each by Other route as needed for other.   potassium gluconate 595 (99 K) MG TABS tablet, Take 650 mg by mouth daily.   TART CHERRY PO, Take 2,400 mg by mouth at bedtime.   TURMERIC PO, Take 500 mg by mouth daily with  lunch.   Zinc 50 MG TABS, Take 50 mg by mouth daily.   meclizine (ANTIVERT) 12.5 MG tablet, Take 12.5 mg by mouth 2 (two) times daily as needed for dizziness or nausea.   valACYclovir (VALTREX) 1000 MG tablet, Take 1,000 mg by mouth daily.   Reviewed prior external information including notes and imaging from  primary care provider As well as notes that were available from care everywhere and other healthcare systems.  Past medical history, social, surgical and family history all reviewed in electronic medical record.  No pertanent information unless stated regarding to the chief complaint.   Review of Systems:  No headache, visual changes, nausea, vomiting, diarrhea, constipation, dizziness, abdominal pain, skin rash, fevers, chills, night sweats, weight loss, swollen lymph nodes, body aches, joint swelling, chest pain, shortness of breath, mood changes. POSITIVE muscle aches  Objective  Blood pressure 110/80, pulse 60, height 5' 4 (1.626 m), weight 236 lb (107 kg), SpO2 98%.   General: No apparent distress alert and oriented x3 mood and affect normal, dressed appropriately.  HEENT: Pupils equal, extraocular movements intact  Respiratory: Patient's speak in full sentences and does not appear short of breath  Cardiovascular: No lower extremity edema, non tender, no erythema  In bilateral thumbs do have tenderness to palpation noted.  Patient does have swelling noted as well.  Procedure: Real-time Ultrasound Guided Injection of right CMC joint Device: GE Logiq Q7 Ultrasound guided injection is preferred based studies that show increased duration, increased effect, greater accuracy, decreased procedural pain, increased response rate, and decreased cost with ultrasound guided versus blind injection.  Verbal informed consent obtained.  Time-out conducted.  Noted no overlying erythema, induration, or other signs of local infection.  Skin prepped in a sterile fashion.  Local anesthesia:  Topical Ethyl chloride.  With sterile technique and under real time ultrasound guidance: With a 25-gauge half inch needle noted.  Patient was injected with 0.5 cc of 0.5% Marcaine and 0.5 cc of Kenalog  40 mg/mL. Completed without difficulty  Pain immediately resolved suggesting accurate placement of the medication.  Advised to call if fevers/chills, erythema, induration, drainage, or persistent bleeding.  Impression: Technically successful ultrasound guided injection.  Procedure: Real-time Ultrasound Guided Injection of left CMC joint Device: GE Logiq Q7 Ultrasound guided injection is preferred based studies that show increased duration, increased effect, greater accuracy, decreased procedural pain, increased response rate, and decreased cost with ultrasound guided versus blind injection.  Verbal informed consent obtained.  Time-out conducted.  Noted no overlying erythema, induration, or other signs of local infection.  Skin prepped in a sterile fashion.  Local anesthesia: Topical Ethyl chloride.  With sterile technique and under real time ultrasound guidance: With a 25-gauge half inch needle  injected with 0.5 cc of 0.5% Marcaine and 0.5 cc of Kenalog  40 mg/mL Completed without difficulty  Pain immediately resolved suggesting accurate placement of the medication.  Advised to call if fevers/chills, erythema, induration, drainage, or persistent bleeding.  Images saved Impression: Technically successful ultrasound guided injection.    Impression and Recommendations:    The above documentation has been reviewed and is accurate and complete Tavie Haseman M Ilani Otterson, DO

## 2024-08-27 ENCOUNTER — Ambulatory Visit: Admitting: Family Medicine

## 2024-08-27 ENCOUNTER — Encounter: Payer: Self-pay | Admitting: Family Medicine

## 2024-08-27 ENCOUNTER — Other Ambulatory Visit: Payer: Self-pay

## 2024-08-27 VITALS — BP 110/80 | HR 60 | Ht 64.0 in | Wt 236.0 lb

## 2024-08-27 DIAGNOSIS — M180 Bilateral primary osteoarthritis of first carpometacarpal joints: Secondary | ICD-10-CM | POA: Diagnosis not present

## 2024-08-27 DIAGNOSIS — M79644 Pain in right finger(s): Secondary | ICD-10-CM | POA: Diagnosis not present

## 2024-08-27 DIAGNOSIS — M79645 Pain in left finger(s): Secondary | ICD-10-CM | POA: Diagnosis not present

## 2024-08-27 NOTE — Assessment & Plan Note (Addendum)
 Repeat injections given again today.  Tolerated the procedure well, discussed icing regimen and home exercises, which activities to do and which ones to avoid.  Increase activity slowly.  Discussed icing regimen.  We discussed the potential need for surgical intervention at some point.  Patient of course would like to avoid it as long as possible.  Discussed nerve conduction studies if needed.  Follow-up again in 6 to 12 weeks.

## 2024-08-27 NOTE — Patient Instructions (Addendum)
 Injected both thumbs See me again in 12 weeks

## 2024-09-06 ENCOUNTER — Ambulatory Visit: Admitting: Podiatry

## 2024-09-06 ENCOUNTER — Ambulatory Visit

## 2024-09-06 DIAGNOSIS — M79674 Pain in right toe(s): Secondary | ICD-10-CM | POA: Diagnosis not present

## 2024-09-06 DIAGNOSIS — B351 Tinea unguium: Secondary | ICD-10-CM | POA: Diagnosis not present

## 2024-09-06 DIAGNOSIS — S92534A Nondisplaced fracture of distal phalanx of right lesser toe(s), initial encounter for closed fracture: Secondary | ICD-10-CM

## 2024-09-06 DIAGNOSIS — M79675 Pain in left toe(s): Secondary | ICD-10-CM | POA: Diagnosis not present

## 2024-09-06 DIAGNOSIS — S90121A Contusion of right lesser toe(s) without damage to nail, initial encounter: Secondary | ICD-10-CM | POA: Diagnosis not present

## 2024-09-06 NOTE — Progress Notes (Signed)
  Subjective:  Patient ID: Caitlyn Manning, female    DOB: 1951-02-24,  MRN: [id number]  Caitlyn Manning presents to clinic today for painful mycotic toenails of both feet that are difficult to trim. Pain interferes with daily activities and wearing enclosed shoe gear comfortably.  Chief Complaint  Patient presents with   Toe Pain    She has an appointment with Dr. Shayne in Jan.2026. She stubbed her 3rd toe on the right foot the other night and its red and painful   New problem(s): Patient stubbed her right 3rd toe. Relates swelling and tenderness.  PCP is Shayne Anes, MD.  Allergies  Allergen Reactions   Tape    Latex Itching and Rash   Review of Systems: Negative except as noted in the HPI.  Objective: No changes noted in today's physical examination. There were no vitals filed for this visit. Caitlyn Manning is a pleasant 73 y.o. female obese in NAD. AAO x 3.  Neurovascular status intact bilaterally and symmetrically.  Dermatological Examination: Pedal skin with normal turgor, texture and tone b/l. No open wounds nor interdigital macerations noted. Toenails 1-5 b/l elongated, discolored, dystrophic, thickened, crumbly with subungual debris and tenderness to dorsal palpation. No hyperkeratotic lesions noted b/l.   Musculoskeletal Examination: Right 3rd digit with edema noted at DIPJ. Transverse ecchymosis noted at DIPJ. Muscle strength 5/5 to b/l LE.  No pain, crepitus noted b/l. No gross pedal deformities. Patient ambulates independently without assistive aids.   Xray findings right foot: No gas in tissues right foot. Soft tissue swelling present R 3rd toe. Plantar calcaneal spur noted right foot. Posterior calcaneal spur noted right foot. No evidence of fracture right foot.  Assessment/Plan: 1. Pain due to onychomycosis of toenails of both feet   2. Contusion of lesser toe of right foot without damage to nail, initial encounter    Consent given for  treatment. Patient examined. All patient's and/or POA's questions/concerns addressed on today's visit. Mycotic toenails 1-5 b/l debrided in length and girth without incident. Xray taken right foot. Continue soft, supportive shoe gear daily. Report any pedal injuries to medical professional. Call office if there are any quesitons/concerns. Patient/POA to call should there be question/concern in the interim.   Return in about 3 months (around 12/07/2024).  Caitlyn Manning, DPM      [facility] LOCATION: [address] [place] [address]                   Office [phone]   [facility] LOCATION: [address] [place], [place] [address] Office [phone]

## 2024-09-12 ENCOUNTER — Encounter: Payer: Self-pay | Admitting: Podiatry

## 2024-10-18 ENCOUNTER — Encounter: Payer: Self-pay | Admitting: Nurse Practitioner

## 2024-10-18 ENCOUNTER — Ambulatory Visit: Admitting: Nurse Practitioner

## 2024-10-18 VITALS — BP 138/82 | HR 77 | Temp 98.5°F | Ht 64.0 in | Wt 236.6 lb

## 2024-10-18 DIAGNOSIS — R051 Acute cough: Secondary | ICD-10-CM

## 2024-10-18 DIAGNOSIS — R490 Dysphonia: Secondary | ICD-10-CM | POA: Diagnosis not present

## 2024-10-18 DIAGNOSIS — G4733 Obstructive sleep apnea (adult) (pediatric): Secondary | ICD-10-CM

## 2024-10-18 DIAGNOSIS — R053 Chronic cough: Secondary | ICD-10-CM

## 2024-10-18 DIAGNOSIS — R058 Other specified cough: Secondary | ICD-10-CM

## 2024-10-18 DIAGNOSIS — K219 Gastro-esophageal reflux disease without esophagitis: Secondary | ICD-10-CM | POA: Diagnosis not present

## 2024-10-18 DIAGNOSIS — J45909 Unspecified asthma, uncomplicated: Secondary | ICD-10-CM

## 2024-10-18 LAB — NITRIC OXIDE: Nitric Oxide: 12

## 2024-10-18 MED ORDER — PREDNISONE 20 MG PO TABS: 20.0000 mg | ORAL_TABLET | Freq: Every day | ORAL | 0 refills | Status: AC

## 2024-10-18 MED ORDER — BENZONATATE 200 MG PO CAPS: 200.0000 mg | ORAL_CAPSULE | Freq: Three times a day (TID) | ORAL | 1 refills | Status: AC | PRN

## 2024-10-18 MED ORDER — IPRATROPIUM BROMIDE 0.06 % NA SOLN: 2.0000 | Freq: Three times a day (TID) | NASAL | 12 refills | Status: AC

## 2024-10-18 MED ORDER — PROMETHAZINE-DM 6.25-15 MG/5ML PO SYRP: 5.0000 mL | ORAL_SOLUTION | Freq: Four times a day (QID) | ORAL | 0 refills | Status: AC | PRN

## 2024-10-18 NOTE — Patient Instructions (Addendum)
 Continue Albuterol  inhaler 2 puffs every 6 hours as needed for shortness of breath or wheezing. Notify if symptoms persist despite rescue inhaler/neb use.  Continue Breo 1 puff daily. Brush tongue and rinse mouth afterwards  Continue omeprazole   Famotidine over the counter 20 mg 1-2 times a day for the next week then as needed for reflux  Benzonatate  1 capsule Three times a day for cough. Use consistently over the next week then as needed  Promethazine  DM cough syrup 5 mL every 6 hours as needed for cough. Do not drive after taking. May cause drowsiness  Prednisone  20 mg daily for 5 days  Ipratropium nasal spray 2 sprays each nostril Three times a day as needed for nasal congestion/drainage   Referral to ENT   Upper airway cough syndrome: Suppress your cough to allow your larynx (voice box) to heal.  Limit talking for the next few days. Avoid throat clearing. Work on cough suppression with the above recommended suppressants.  Use sugar free hard candies or non-menthol cough drops during this time to soothe your throat.  Warm tea with honey and lemon.    Continue to use CPAP every night, minimum of 4-6 hours a night.  Change equipment as directed. Wash your tubing with warm soap and water daily, hang to dry. Wash humidifier portion weekly. Use bottled, distilled water and change daily Be aware of reduced alertness and do not drive or operate heavy machinery if experiencing this or drowsiness.  Exercise encouraged, as tolerated. Healthy weight management discussed.  Avoid or decrease alcohol  consumption and medications that make you more sleepy, if possible. Notify if persistent daytime sleepiness occurs even with consistent use of PAP therapy.  Change CPAP supplies... Every month Mask cushions and/or nasal pillows CPAP machine filters Every 3 months Mask frame (not including the headgear) CPAP tubing Every 6 months Mask headgear Chin strap (if applicable) Humidifier water tub    We discussed how untreated sleep apnea puts an individual at risk for cardiac arrhthymias, pulm HTN, DM, stroke and increases their risk for daytime accidents. Encourage healthy weight loss measures as well as continued CPAP use  Follow up in 4 months with Dr. Isaiah. If symptoms do not improve or worsen, please contact office for sooner follow up or seek emergency care.

## 2024-10-18 NOTE — Progress Notes (Signed)
 "  @Patient  ID: Caitlyn Manning, female    DOB: 09-04-1951, 74 y.o.   MRN: [id number]  Chief Complaint  Patient presents with   Sleep Apnea    Set up on CPAP on 07/29/2024. Has not helped with her fatigue.     Referring provider: Shayne Anes, MD  HPI: 74 year old female, never smoker followed for OSA on CPAP and asthma. She is a patient of Dr. Jacqulyn and last seen in office 07/29/2024. Past medical history significant for MVR, IBS, GERD, vertigo, HLD.   TEST/EVENTS:  02/2024 PFT: FVC 89, FEV1 97, ratio 83, DLCO 91% 02/2024 HST: AHI 13.7/h, SpO2 low 81%  07/29/2024: OV with Dr. Isaiah. Breo does help with chronic cough. Also on loratadine and flonase. PFT normal aside from air trapping. Using albuterol  prior to exertion and singing. Hx of severe OSA in REM sleep. Sleeping well on CPAP.   10/18/2024: Today - follow up Discussed the use of AI scribe software for clinical note transcription with the patient, who gave verbal consent to proceed.  History of Present Illness Caitlyn Manning is a 74 year old female who presents for follow up.   She received a new CPAP machine, which she has been using nightly up until the last two weeks when she developed a cough.   She has been experiencing a persistent cough for the past two weeks, triggered by deep inhalation and dry air. She tends to have cyclic episodes of cough, with the cough recurring every three months, lasting from a few weeks to a couple of months, with this episode being shorter than previous ones. She uses Breo and albuterol , with increased albuterol  usage to three times a day when active or singing in the choir. The Breo did seem to initially help, and she does get worse when not using it. Nasacort  nasal spray is also part of her regimen. No increased shortness of breath. No fevers, chills, hemoptysis, chest pain, calf pain/swelling. No significant sinus congestion. She did notice some wheezing last week but no current  issues.   She has a history of reflux, which has worsened over the last two days. Omeprazole is used for reflux management. No abdominal pain, N/V, changes in bowel habits.   She has noticed a change in her voice over the past several months. Seems more hoarse, especially with singing. No difficulties swallowing, sore throat.   She has mild to moderate sleep apnea, diagnosed in a sleep study conducted in May. She isn't sure that the CPAP has made a huge difference in her overall fatigue symptoms. She denies any drowsy driving. No issues with pressure settings or mask fit.   09/17/2024-10/16/2024: CPAP 4-16 cmH2O 18/30 days; 53% >4 hr; average use 6 hr 34 min Pressure 95th 11.7 Leaks 95th 32.5 AHI 1.4  Allergies[1]  Immunization History  Administered Date(s) Administered   Fluad Quad(high Dose 65+) 08/12/2024   Fluzone Influenza virus vaccine,trivalent (IIV3), split virus 07/12/2010   INFLUENZA, HIGH DOSE SEASONAL PF 07/02/2018   Influenza Inj Mdck Quad Pf 07/27/2016   Influenza Split 01/17/2010, 05/11/2010, 07/10/2011, 07/01/2014, 07/31/2020   Influenza, Mdck, Trivalent,PF 6+ MOS(egg free) 07/27/2016   Influenza, Quadrivalent, Recombinant, Inj, Pf 08/22/2023   Influenza,inj,quad, With Preservative 07/02/2018   Influenza-Unspecified 07/27/2016   PNEUMOCOCCAL CONJUGATE-20 07/23/2022   Pneumococcal Conjugate-13 09/14/2013   Pneumococcal Polysaccharide-23 01/17/2010, 07/23/2022   Td 01/17/2010, 07/31/2011, 05/14/2021   Td (Adult) 05/14/2021   Td (Adult),5 Lf Tetanus Toxid, Preservative Free 01/17/2010, 07/31/2011  Tdap 10/24/2014   Zoster, Live 08/10/2010    Past Medical History:  Diagnosis Date   Allergy    seasonal   Arthritis    Asthma 10/13/23   I was having trouble with shortness of breathe and couching. PA decided to try treating as Asthma to see what happened.   Cataract    Clotting disorder    I have always bleed very easily even from just a minor cut or scratch.    GERD (gastroesophageal reflux disease)     Tobacco History: Tobacco Use History[2] Counseling given: Not Answered   Outpatient Medications Prior to Visit  Medication Sig Dispense Refill   albuterol  (VENTOLIN  HFA) 108 (90 Base) MCG/ACT inhaler Inhale 2 puffs into the lungs every 6 (six) hours as needed for shortness of breath. 8 g 10   BREO ELLIPTA  200-25 MCG/ACT AEPB Inhale 1 puff into the lungs daily. 28 each 12   Calcium Carb-Cholecalciferol (CALCIUM 600+D) 600-800 MG-UNIT TABS Take 2 tablets by mouth daily.     ELDERBERRY PO Take 400 mg by mouth daily.     Lancets (ONETOUCH DELICA PLUS LANCET30G) MISC USE TO TEST BLOOD SUGARS ONCE DAILY (DX: ICD10: R73.01) 90 DAYS     meloxicam  (MOBIC ) 15 MG tablet Take 1 tablet (15 mg total) by mouth daily. (Patient taking differently: Take 15 mg by mouth daily as needed for pain.) 30 tablet 3   Multiple Vitamins-Minerals (CENTRUM SILVER ULTRA WOMENS PO) Take 1 tablet by mouth daily with lunch.     MYRBETRIQ 50 MG TB24 tablet Take 50 mg by mouth every 3 (three) days.  11   omeprazole (PRILOSEC) 20 MG capsule Take 20 mg by mouth daily.     ONETOUCH VERIO test strip 1 each by Other route as needed for other.     potassium gluconate 595 (99 K) MG TABS tablet Take 650 mg by mouth daily.     TART CHERRY PO Take 2,400 mg by mouth at bedtime.     triamcinolone  (NASACORT  ALLERGY 24HR) 55 MCG/ACT AERO nasal inhaler Place 2 sprays into the nose daily.     TURMERIC PO Take 500 mg by mouth daily with lunch.     Zinc 50 MG TABS Take 50 mg by mouth daily.     meclizine (ANTIVERT) 12.5 MG tablet Take 12.5 mg by mouth 2 (two) times daily as needed for dizziness or nausea.     valACYclovir (VALTREX) 1000 MG tablet Take 1,000 mg by mouth daily.     No facility-administered medications prior to visit.     Review of Systems: as above    Physical Exam:  BP 138/82   Pulse 77   Temp 98.5 F (36.9 C)   Ht 5' 4 (1.626 m)   Wt 236 lb 9.6 oz (107.3 kg)   SpO2  98%   BMI 40.61 kg/m   GEN: Pleasant, interactive, well-appearing; obese; in no acute distress HEENT:  Normocephalic and atraumatic. PERRLA. Sclera white. Nasal turbinates pink, moist and patent bilaterally. White rhinorrhea present. Oropharynx erythematous and moist, without exudate or edema. No lesions, ulcerations NECK:  Supple w/ fair ROM. No JVD present. No lymphadenopathy.   CV: RRR, no m/r/g, no peripheral edema. Pulses intact, +2 bilaterally. No cyanosis, pallor or clubbing. PULMONARY:  Unlabored, regular breathing. Clear b/l A&P w/o wheezes/rales/rhonchi. No accessory muscle use.  GI: BS present and normoactive. Soft, non-tender to palpation. MSK: No erythema, warmth or tenderness. Cap refil <2 sec all extrem.  Neuro: A/Ox3. No focal  deficits noted.   Skin: Warm, no lesions or rashe Psych: Normal affect and behavior. Judgement and thought content appropriate.     Lab Results:  CBC    Component Value Date/Time   WBC 7.0 01/20/2024 1553   RBC 4.69 01/20/2024 1553   HGB 12.7 01/20/2024 1553   HCT 39.6 01/20/2024 1553   PLT 368 01/20/2024 1553   MCV 84 01/20/2024 1553   MCH 27.1 01/20/2024 1553   MCHC 32.1 01/20/2024 1553   RDW 13.7 01/20/2024 1553    BMET    Component Value Date/Time   NA 143 01/20/2024 1553   K 4.2 01/20/2024 1553   CL 105 01/20/2024 1553   CO2 23 01/20/2024 1553   GLUCOSE 105 (H) 01/20/2024 1553   BUN 12 01/20/2024 1553   CREATININE 0.83 01/20/2024 1553   CALCIUM 9.4 01/20/2024 1553    BNP No results found for: BNP   Imaging:  No results found.  Administration History     None          Latest Ref Rng & Units 02/19/2024    9:40 AM  PFT Results  FVC-Pre L 2.41   FVC-Predicted Pre % 82   FVC-Post L 2.60   FVC-Predicted Post % 89   Pre FEV1/FVC % % 84   Post FEV1/FCV % % 83   FEV1-Pre L 2.01   FEV1-Predicted Pre % 91   FEV1-Post L 2.15   DLCO uncorrected ml/min/mmHg 17.48   DLCO UNC% % 91   DLVA Predicted % 95   TLC L  6.98   TLC % Predicted % 138   RV % Predicted % 199     Lab Results  Component Value Date   NITRICOXIDE 12 10/18/2024        Assessment & Plan:   Assessment & Plan Upper airway cough syndrome Likely due to post-viral cough and upper airway irritation. Coughing spells triggered by dry air and postnasal drainage. Exhaled nitric oxide  testing normal today, indicating no significant type II airway inflammation. She has cyclic episodes with associate voice hoarseness. Will treat current flare with prednisone  burst, cough control measures and target postnasal drainage/maximize GERD control. Side effect profiles reviewed. Refer to ENT for direct laryngoscopy for further evaluation. Advised on measures to minimize upper airway irritation/inflammation.  - Prescribed promethazine  DM cough syrup and benzonatate  as needed for coughing  - Added a ipratropium nasal spray to address postnasal drainage. - Prescribed prednisone  20 mg daily for 5 days to reduce inflammation. - Referred to ENT for evaluation of vocal cords.  Dysphonia Likely related to upper airway irritation and vocal cord inflammation. Voice changes noted, especially during choir singing. Coughing spells may contribute to vocal cord irritation. - Referred to ENT for evaluation of vocal cords. - Minimize cough   Asthma Does not seem to be asthma driven. She does receive benefit from use of Breo so will continue this. Prior PFT normal. Action plan in place. - Continue Breo daily - Continue albuterol  PRN  Obstructive sleep apnea Mild to moderate obstructive sleep apnea with an AHI of 13.7 events per hour. Well controlled on CPAP therapy with benefit from use. Fatigue persists; likely multifactorial related to deconditioning and obesity. Reviewed risks of untreated OSA and purpose of treatment. Encouraged healthy weight loss measures to potentially reduce or resolve OSA. Safe driving practices reviewed. Encouraged f/u with PCP  regarding fatigue. Advised to resume CPAP once cough resolved.  - Continue CPAP therapy once cough subsides. - Encouraged weight loss  to potentially resolve sleep apnea. - Discussed long-term risks of untreated sleep apnea, including cardiovascular complications.  Gastroesophageal reflux disease Recent exacerbation of symptoms over the last two days. Reflux may contribute to upper airway irritation and cough. No red flag symptoms. GERD precautions reviewed.  - Continue omeprazole for reflux management. - Added famotidine (Pepcid) to manage breakthrough reflux symptoms.    Advised if symptoms do not improve or worsen, to please contact office for sooner follow up or seek emergency care.   I spent 41 minutes of dedicated to the care of this patient on the date of this encounter to include pre-visit review of records, face-to-face time with the patient discussing conditions above, post visit ordering of testing, clinical documentation with the electronic health record, making appropriate referrals as documented, and communicating necessary findings to members of the patients care team.  Comer LULLA Rouleau, NP 10/18/2024  Pt aware and understands NP's role.      [1]  Allergies Allergen Reactions   Tape    Latex Itching and Rash  [2]  Social History Tobacco Use  Smoking Status Never  Smokeless Tobacco Never   "

## 2024-10-20 ENCOUNTER — Encounter (INDEPENDENT_AMBULATORY_CARE_PROVIDER_SITE_OTHER): Payer: Self-pay

## 2024-11-16 ENCOUNTER — Ambulatory Visit (INDEPENDENT_AMBULATORY_CARE_PROVIDER_SITE_OTHER): Admitting: Otolaryngology

## 2024-11-16 ENCOUNTER — Encounter (INDEPENDENT_AMBULATORY_CARE_PROVIDER_SITE_OTHER): Payer: Self-pay | Admitting: Otolaryngology

## 2024-11-16 VITALS — BP 126/76 | HR 75 | Temp 97.8°F | Ht 64.75 in | Wt 234.0 lb

## 2024-11-16 DIAGNOSIS — R490 Dysphonia: Secondary | ICD-10-CM | POA: Diagnosis not present

## 2024-11-16 DIAGNOSIS — R059 Cough, unspecified: Secondary | ICD-10-CM

## 2024-11-16 DIAGNOSIS — J3089 Other allergic rhinitis: Secondary | ICD-10-CM

## 2024-11-16 DIAGNOSIS — K219 Gastro-esophageal reflux disease without esophagitis: Secondary | ICD-10-CM

## 2024-11-16 MED ORDER — PANTOPRAZOLE SODIUM 40 MG PO TBEC: 40.0000 mg | DELAYED_RELEASE_TABLET | Freq: Two times a day (BID) | ORAL | 1 refills | Status: AC

## 2024-12-03 ENCOUNTER — Ambulatory Visit: Admitting: Family Medicine

## 2024-12-14 ENCOUNTER — Ambulatory Visit (INDEPENDENT_AMBULATORY_CARE_PROVIDER_SITE_OTHER): Admitting: Otolaryngology

## 2025-01-21 ENCOUNTER — Ambulatory Visit: Admitting: Cardiovascular Disease

## 2025-01-28 ENCOUNTER — Ambulatory Visit: Admitting: Family Medicine

## 2025-02-24 ENCOUNTER — Ambulatory Visit: Admitting: Internal Medicine
